# Patient Record
Sex: Female | Born: 2000 | Race: Black or African American | Hispanic: No | Marital: Single | Smoking: Never smoker
Health system: Southern US, Community
[De-identification: ages and names within clinical notes are randomized; demographics above are authoritative.]

## PROBLEM LIST (undated history)

## (undated) DIAGNOSIS — J45909 Unspecified asthma, uncomplicated: Secondary | ICD-10-CM

---
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
## 2018-08-10 ENCOUNTER — Other Ambulatory Visit: Payer: Self-pay

## 2018-08-10 ENCOUNTER — Emergency Department (HOSPITAL_BASED_OUTPATIENT_CLINIC_OR_DEPARTMENT_OTHER)
Admission: EM | Admit: 2018-08-10 | Discharge: 2018-08-11 | Disposition: A | Discharge status: Home / Self Care | Payer: Self-pay | Attending: Emergency Medicine | Admitting: Emergency Medicine

## 2018-08-10 ENCOUNTER — Encounter (HOSPITAL_BASED_OUTPATIENT_CLINIC_OR_DEPARTMENT_OTHER): Payer: Self-pay

## 2018-08-10 DIAGNOSIS — K529 Noninfective gastroenteritis and colitis, unspecified: Secondary | ICD-10-CM | POA: Insufficient documentation

## 2018-08-10 DIAGNOSIS — J45909 Unspecified asthma, uncomplicated: Secondary | ICD-10-CM | POA: Insufficient documentation

## 2018-08-10 LAB — URINALYSIS, ROUTINE W REFLEX MICROSCOPIC
Bilirubin Urine: NEGATIVE
Glucose, UA: NEGATIVE mg/dL
KETONES UR: 15 mg/dL — AB
LEUKOCYTES UA: NEGATIVE
NITRITE: NEGATIVE
PH: 5.5 (ref 5.0–8.0)
Protein, ur: 30 mg/dL — AB
Specific Gravity, Urine: 1.02 (ref 1.005–1.030)

## 2018-08-10 LAB — CBC WITH DIFFERENTIAL/PLATELET
BASOS ABS: 0 10*3/uL (ref 0.0–0.1)
Basophils Relative: 0 %
EOS PCT: 1 %
Eosinophils Absolute: 0 10*3/uL (ref 0.0–0.7)
HCT: 36.3 % (ref 36.0–46.0)
Hemoglobin: 11.9 g/dL — ABNORMAL LOW (ref 12.0–15.0)
LYMPHS ABS: 2 10*3/uL (ref 0.7–4.0)
LYMPHS PCT: 29 %
MCH: 27.2 pg (ref 26.0–34.0)
MCHC: 32.8 g/dL (ref 30.0–36.0)
MCV: 83.1 fL (ref 78.0–100.0)
MONO ABS: 0.5 10*3/uL (ref 0.1–1.0)
Monocytes Relative: 8 %
Neutro Abs: 4.1 10*3/uL (ref 1.7–7.7)
Neutrophils Relative %: 62 %
PLATELETS: 320 10*3/uL (ref 150–400)
RBC: 4.37 MIL/uL (ref 3.87–5.11)
RDW: 14.1 % (ref 11.5–15.5)
WBC: 6.7 10*3/uL (ref 4.0–10.5)

## 2018-08-10 LAB — COMPREHENSIVE METABOLIC PANEL
ALT: 19 U/L (ref 0–44)
ANION GAP: 12 (ref 5–15)
AST: 25 U/L (ref 15–41)
Albumin: 4.5 g/dL (ref 3.5–5.0)
Alkaline Phosphatase: 56 U/L (ref 38–126)
BILIRUBIN TOTAL: 0.6 mg/dL (ref 0.3–1.2)
BUN: 13 mg/dL (ref 6–20)
CALCIUM: 9.3 mg/dL (ref 8.9–10.3)
CO2: 24 mmol/L (ref 22–32)
Chloride: 106 mmol/L (ref 98–111)
Creatinine, Ser: 0.89 mg/dL (ref 0.44–1.00)
GFR calc non Af Amer: >60 mL/min (ref >60)
Glucose, Bld: 89 mg/dL (ref 70–99)
POTASSIUM: 3.5 mmol/L (ref 3.5–5.1)
Sodium: 142 mmol/L (ref 135–145)
TOTAL PROTEIN: 8.3 g/dL — AB (ref 6.5–8.1)

## 2018-08-10 LAB — URINALYSIS, MICROSCOPIC (REFLEX)

## 2018-08-10 LAB — PREGNANCY, URINE: Preg Test, Ur: NEGATIVE

## 2018-08-10 MED ORDER — SODIUM CHLORIDE 0.9 % IV BOLUS
1000.0000 mL | Freq: Once | INTRAVENOUS | Status: AC
  Administered 2018-08-10: 1000 mL via INTRAVENOUS

## 2018-08-10 MED ORDER — ONDANSETRON HCL 4 MG/2ML IJ SOLN
4.0000 mg | Freq: Once | INTRAMUSCULAR | Status: AC
  Administered 2018-08-10: 4 mg via INTRAVENOUS
  Filled 2018-08-10: qty 2

## 2018-08-10 NOTE — Discharge Instructions (Signed)
Please take medications as directed.  Please follow up with your primary care provider within 5-7 days for re-evaluation of your symptoms.   Please follow up with your primary doctor within the next 5-7 days.  If you do not have a primary care provider, information for a healthcare clinic has been provided for you to make arrangements for follow up care. Please return to the ER sooner if you have any new or worsening symptoms, or if you have any of the following symptoms:  Abdominal pain that does not go away.  You have a fever.  You keep throwing up (vomiting).  The pain is felt only in portions of the abdomen. Pain in the right side could possibly be appendicitis. In an adult, pain in the left lower portion of the abdomen could be colitis or diverticulitis.  You pass bloody or black tarry stools.  There is bright red blood in the stool.  The constipation stays for more than 4 days.  There is belly (abdominal) or rectal pain.  You do not seem to be getting better.  You have any questions or concerns.

## 2018-08-10 NOTE — ED Notes (Signed)
ED Provider at bedside. 

## 2018-08-10 NOTE — ED Triage Notes (Signed)
C/o n/v d day 2--NAD-steady gait 

## 2018-08-10 NOTE — ED Notes (Signed)
Pt tolerating PO fluids

## 2018-08-10 NOTE — ED Provider Notes (Signed)
[facility] EMERGENCY DEPARTMENT Provider Note   CSN: [id number] Arrival date & time: 08/10/18  2048     History   Chief Complaint Chief Complaint  Patient presents with  . Emesis    HPI Maddyn Kreager is a 17 y.o. female.  HPI   Patient is a 17 year old female with a history of asthma presents emergency department today for evaluation of nausea, vomiting and diarrhea that began yesterday after she ate a McMuffin.  Denies any bloody stools.  No fevers or chills.  No abdominal pain or urinary symptoms.  Denies any sick contacts with similar symptoms.  No recent trips out of the country.  Denies knowledge of eating any undercooked or rotten foods recently.  Past Medical History:  Diagnosis Date  . Asthma     There are no active problems to display for this patient.   History reviewed. No pertinent surgical history.   OB History   None      Home Medications    Prior to Admission medications   Medication Sig Start Date End Date Taking? Authorizing Provider  ondansetron (ZOFRAN) 4 MG tablet Take 1 tablet (4 mg total) by mouth every 8 (eight) hours as needed for nausea or vomiting. 08/11/18   Yuki Brunsman S, PA-C    Family History No family history on file.  Social History Social History   Tobacco Use  . Smoking status: Never Smoker  . Smokeless tobacco: Never Used  Substance Use Topics  . Alcohol use: Never    Frequency: Never  . Drug use: Never     Allergies   Patient has no known allergies.   Review of Systems Review of Systems  Constitutional: Negative for chills and fever.  HENT: Negative for congestion.   Eyes: Negative for visual disturbance.  Respiratory: Negative for shortness of breath.   Cardiovascular: Negative for chest pain.  Gastrointestinal: Positive for abdominal pain, diarrhea, nausea and vomiting. Negative for constipation.  Genitourinary: Negative for dysuria, frequency, hematuria and urgency.  Musculoskeletal:  Negative for back pain.  Skin: Negative for rash.  Neurological: Negative for headaches.    Physical Exam Updated Vital Signs BP 115/66 (BP Location: Left Arm)   Pulse 70   Temp 99 F (37.2 C) (Oral)   Resp 18   Ht 5\' 3"  (1.6 m)   Wt 78.5 kg   LMP 08/06/2018   SpO2 100%   BMI 30.65 kg/m   Physical Exam  Constitutional: She appears well-developed and well-nourished. No distress.  HENT:  Head: Normocephalic and atraumatic.  Eyes: Conjunctivae are normal.  Neck: Neck supple.  Cardiovascular: Normal rate, regular rhythm and normal heart sounds.  No murmur heard. Pulmonary/Chest: Effort normal and breath sounds normal. No stridor. No respiratory distress. She has no wheezes.  Abdominal: Soft. Bowel sounds are normal. She exhibits no distension. There is no tenderness. There is no guarding.  No CVA TTP  Musculoskeletal: She exhibits no edema.  Neurological: She is alert.  Skin: Skin is warm and dry.  Psychiatric: She has a normal mood and affect.  Nursing note and vitals reviewed.   ED Treatments / Results  Labs (all labs ordered are listed, but only abnormal results are displayed) Labs Reviewed  URINALYSIS, ROUTINE W REFLEX MICROSCOPIC - Abnormal; Notable for the following components:      Result Value   Hgb urine dipstick SMALL (*)    Ketones, ur 15 (*)    Protein, ur 30 (*)    All other components within  normal limits  URINALYSIS, MICROSCOPIC (REFLEX) - Abnormal; Notable for the following components:   Bacteria, UA RARE (*)    All other components within normal limits  CBC WITH DIFFERENTIAL/PLATELET - Abnormal; Notable for the following components:   Hemoglobin 11.9 (*)    All other components within normal limits  COMPREHENSIVE METABOLIC PANEL - Abnormal; Notable for the following components:   Total Protein 8.3 (*)    All other components within normal limits  PREGNANCY, URINE    EKG None  Radiology No results found.  Procedures Procedures (including  critical care time)  Medications Ordered in ED Medications  sodium chloride 0.9 % bolus 1,000 mL (0 mLs Intravenous Stopped 08/10/18 2353)  ondansetron (ZOFRAN) injection 4 mg (4 mg Intravenous Given 08/10/18 2329)     Initial Impression / Assessment and Plan / ED Course  I have reviewed the triage vital signs and the nursing notes.  Pertinent labs & imaging results that were available during my care of the patient were reviewed by me and considered in my medical decision making (see chart for details).     Final Clinical Impressions(s) / ED Diagnoses   Final diagnoses:  Gastroenteritis   Patient with symptoms consistent with viral gastroenteritis.  Vitals are stable, no fever.  No signs of dehydration, tolerating PO fluids > 6 oz.  Lungs are clear.  No focal abdominal pain, no concern for appendicitis, cholecystitis, pancreatitis, ruptured viscus, UTI, kidney stone, or any other abdominal etiology.    CBC is reassuring without leukocytosis.  Very mild anemia.  CMP with normal electrolytes, kidney and liver function.  UA is not suggestive of urinary tract infection, there is hematuria, proteinuria, and ketonuria. Pregnancy test negative.  Supportive therapy indicated with return if symptoms worsen.  Patient counseled on return precautions. Pt voiced understanding of the plan and reasons to return. All questions answered.  ED Discharge Orders         Ordered    ondansetron (ZOFRAN) 4 MG tablet  Every 8 hours PRN     08/11/18 0012           Karrie Meres, PA-C 08/11/18 0014    Tegeler, Canary Brim, MD 08/11/18 [id number]

## 2018-08-11 MED ORDER — ONDANSETRON HCL 4 MG PO TABS: 4.0000 mg | ORAL_TABLET | Freq: Three times a day (TID) | ORAL | 0 refills | Status: DC | PRN

## 2018-12-10 ENCOUNTER — Encounter (HOSPITAL_BASED_OUTPATIENT_CLINIC_OR_DEPARTMENT_OTHER): Payer: Self-pay

## 2018-12-10 ENCOUNTER — Emergency Department (HOSPITAL_BASED_OUTPATIENT_CLINIC_OR_DEPARTMENT_OTHER)
Admission: EM | Admit: 2018-12-10 | Discharge: 2018-12-10 | Disposition: A | Discharge status: Home / Self Care | Payer: Managed Care, Other (non HMO) | Attending: Emergency Medicine | Admitting: Emergency Medicine

## 2018-12-10 ENCOUNTER — Emergency Department (HOSPITAL_BASED_OUTPATIENT_CLINIC_OR_DEPARTMENT_OTHER): Payer: Managed Care, Other (non HMO)

## 2018-12-10 DIAGNOSIS — Z79899 Other long term (current) drug therapy: Secondary | ICD-10-CM | POA: Insufficient documentation

## 2018-12-10 DIAGNOSIS — M546 Pain in thoracic spine: Secondary | ICD-10-CM | POA: Diagnosis present

## 2018-12-10 DIAGNOSIS — J45909 Unspecified asthma, uncomplicated: Secondary | ICD-10-CM | POA: Diagnosis not present

## 2018-12-10 MED ORDER — IBUPROFEN 600 MG PO TABS: 600.0000 mg | ORAL_TABLET | Freq: Four times a day (QID) | ORAL | 0 refills | Status: DC | PRN

## 2018-12-10 MED ORDER — ACETAMINOPHEN 500 MG PO TABS: 500.0000 mg | ORAL_TABLET | Freq: Four times a day (QID) | ORAL | 0 refills | Status: DC | PRN

## 2018-12-10 MED ORDER — METHOCARBAMOL 500 MG PO TABS: 500.0000 mg | ORAL_TABLET | Freq: Two times a day (BID) | ORAL | 0 refills | Status: DC

## 2018-12-10 NOTE — ED Triage Notes (Signed)
Pt c/o upper back pain x 2 weeks-worse x 2 days after lifting tray at [facility] gait

## 2018-12-10 NOTE — ED Provider Notes (Signed)
[facility] EMERGENCY DEPARTMENT Provider Note   CSN: [id number] Arrival date & time: 12/10/18  1725     History   Chief Complaint Chief Complaint  Patient presents with  . Back Pain    HPI Robin Cabrera is a 18 y.o. female with history of asthma who presents with a 4-day history of upper back pain.  Patient reports she has had upper back pain for several years, however she lifted a heavy tray at work a few days ago and has had pain since.  Patient denies any numbness or tingling.  Her pain is worse when she sits a while.  She denies any fever, history of procedure to back, known cancer, history of IVDU, saddle anesthesia, bowel or bladder incontinence.  She has not tried anything at home for symptoms.  HPI  Past Medical History:  Diagnosis Date  . Asthma     There are no active problems to display for this patient.   History reviewed. No pertinent surgical history.   OB History   No obstetric history on file.      Home Medications    Prior to Admission medications   Medication Sig Start Date End Date Taking? Authorizing Provider  ondansetron (ZOFRAN) 4 MG tablet Take 1 tablet (4 mg total) by mouth every 8 (eight) hours as needed for nausea or vomiting. 08/11/18   Couture, Cortni S, PA-C    Family History No family history on file.  Social History Social History   Tobacco Use  . Smoking status: Never Smoker  . Smokeless tobacco: Never Used  Substance Use Topics  . Alcohol use: Never    Frequency: Never  . Drug use: Never     Allergies   Patient has no known allergies.   Review of Systems Review of Systems  Musculoskeletal: Positive for back pain.  Neurological: Negative for numbness.     Physical Exam Updated Vital Signs BP 125/81 (BP Location: Left Arm)   Pulse (!) 112   Temp 98.4 F (36.9 C) (Oral)   Resp 18   Ht 5\' 3"  (1.6 m)   Wt 79.8 kg   LMP 12/10/2018   SpO2 100%   BMI 31.18 kg/m   Physical Exam Vitals signs and  nursing note reviewed.  Constitutional:      General: She is not in acute distress.    Appearance: She is well-developed. She is not diaphoretic.  HENT:     Head: Normocephalic and atraumatic.     Mouth/Throat:     Pharynx: No oropharyngeal exudate.  Eyes:     General: No scleral icterus.       Right eye: No discharge.        Left eye: No discharge.     Conjunctiva/sclera: Conjunctivae normal.     Pupils: Pupils are equal, round, and reactive to light.  Neck:     Musculoskeletal: Normal range of motion and neck supple.     Thyroid: No thyromegaly.  Cardiovascular:     Rate and Rhythm: Normal rate and regular rhythm.     Heart sounds: Normal heart sounds. No murmur. No friction rub. No gallop.   Pulmonary:     Effort: Pulmonary effort is normal. No respiratory distress.     Breath sounds: Normal breath sounds. No stridor. No wheezing or rales.  Abdominal:     General: Bowel sounds are normal. There is no distension.     Palpations: Abdomen is soft.     Tenderness: There is no  abdominal tenderness. There is no guarding or rebound.  Musculoskeletal:     Comments: Midline thoracic tenderness, no midline cervical and lumbar tenderness with tenderness and spasm to bilateral upper trapezius muscles  Lymphadenopathy:     Cervical: No cervical adenopathy.  Skin:    General: Skin is warm and dry.     Coloration: Skin is not pale.     Findings: No rash.  Neurological:     Mental Status: She is alert.     Coordination: Coordination normal.     Comments: Equal bilateral grip strength, 5/5 strength all 4 extremities, normal sensation      ED Treatments / Results  Labs (all labs ordered are listed, but only abnormal results are displayed) Labs Reviewed - No data to display  EKG None  Radiology Dg Thoracic Spine W/swimmers  Result Date: 12/10/2018 CLINICAL DATA:  Thoracic spine pain for 4 days.  No known injury. EXAM: THORACIC SPINE - 3 VIEWS COMPARISON:  None. FINDINGS: There  is no evidence of thoracic spine fracture. Alignment is normal. No other significant bone abnormalities are identified. IMPRESSION: Normal exam. Electronically Signed   By: Drusilla Kanner M.D.   On: 12/10/2018 18:29    Procedures Procedures (including critical care time)  Medications Ordered in ED Medications - No data to display   Initial Impression / Assessment and Plan / ED Course  I have reviewed the triage vital signs and the nursing notes.  Pertinent labs & imaging results that were available during my care of the patient were reviewed by me and considered in my medical decision making (see chart for details).     Patient with back pain.  No neurological deficits and normal neuro exam.  Patient is ambulatory.  No loss of bowel or bladder control.  No concern for cauda equina.  No fever, night sweats, weight loss, h/o cancer, IVDA, no recent procedure to back. No urinary symptoms suggestive of UTI.  Supportive care and return precaution discussed.  Patient be discharged home with ibuprofen, Robaxin, Tylenol, recommendations for heat and stretching.  Patient understands and agrees with plan.  Patient vital stable throughout ED course and discharged in satisfactory condition.   Final Clinical Impressions(s) / ED Diagnoses   Final diagnoses:  Thoracic back pain    ED Discharge Orders    None       Emi Holes, Cordelia Poche 12/10/18 1850    Maia Plan, MD 12/11/18 [phone]

## 2018-12-10 NOTE — Discharge Instructions (Signed)
Take ibuprofen every 6 hours as needed for pain.  You can alternate with Tylenol as prescribed.  Take Robaxin twice daily as needed for muscle pain or spasms.  Do not drive or operate machinery while taking this medication.  Use heat alternating 20 minutes on, 20 minutes off.  Attempt the stretches we discussed.  Please return the emergency department you develop any new or worsening symptoms.

## 2019-11-02 ENCOUNTER — Encounter (HOSPITAL_BASED_OUTPATIENT_CLINIC_OR_DEPARTMENT_OTHER): Payer: Self-pay

## 2019-11-02 ENCOUNTER — Emergency Department (HOSPITAL_BASED_OUTPATIENT_CLINIC_OR_DEPARTMENT_OTHER): Payer: 59

## 2019-11-02 ENCOUNTER — Other Ambulatory Visit: Payer: Self-pay

## 2019-11-02 ENCOUNTER — Emergency Department (HOSPITAL_BASED_OUTPATIENT_CLINIC_OR_DEPARTMENT_OTHER)
Admission: EM | Admit: 2019-11-02 | Discharge: 2019-11-02 | Disposition: A | Discharge status: Home / Self Care | Payer: 59 | Attending: Emergency Medicine | Admitting: Emergency Medicine

## 2019-11-02 DIAGNOSIS — A64 Unspecified sexually transmitted disease: Secondary | ICD-10-CM

## 2019-11-02 DIAGNOSIS — A599 Trichomoniasis, unspecified: Secondary | ICD-10-CM | POA: Insufficient documentation

## 2019-11-02 DIAGNOSIS — N760 Acute vaginitis: Secondary | ICD-10-CM | POA: Diagnosis not present

## 2019-11-02 DIAGNOSIS — R1032 Left lower quadrant pain: Secondary | ICD-10-CM | POA: Diagnosis present

## 2019-11-02 DIAGNOSIS — R102 Pelvic and perineal pain: Secondary | ICD-10-CM | POA: Diagnosis not present

## 2019-11-02 DIAGNOSIS — B9689 Other specified bacterial agents as the cause of diseases classified elsewhere: Secondary | ICD-10-CM | POA: Insufficient documentation

## 2019-11-02 LAB — URINALYSIS, ROUTINE W REFLEX MICROSCOPIC
Bilirubin Urine: NEGATIVE
Glucose, UA: NEGATIVE mg/dL
Hgb urine dipstick: NEGATIVE
Ketones, ur: NEGATIVE mg/dL
Leukocytes,Ua: NEGATIVE
Nitrite: NEGATIVE
Protein, ur: NEGATIVE mg/dL
Specific Gravity, Urine: 1.025 (ref 1.005–1.030)
pH: 6.5 (ref 5.0–8.0)

## 2019-11-02 LAB — WET PREP, GENITAL
Sperm: NONE SEEN
Yeast Wet Prep HPF POC: NONE SEEN

## 2019-11-02 LAB — PREGNANCY, URINE: Preg Test, Ur: NEGATIVE

## 2019-11-02 MED ORDER — CEFTRIAXONE SODIUM 250 MG IJ SOLR
250.0000 mg | Freq: Once | INTRAMUSCULAR | Status: AC
  Administered 2019-11-02: 250 mg via INTRAMUSCULAR
  Filled 2019-11-02: qty 250

## 2019-11-02 MED ORDER — AZITHROMYCIN 250 MG PO TABS
1000.0000 mg | ORAL_TABLET | Freq: Once | ORAL | Status: AC
  Administered 2019-11-02: 1000 mg via ORAL
  Filled 2019-11-02: qty 4

## 2019-11-02 MED ORDER — METRONIDAZOLE 500 MG PO TABS: 500.0000 mg | ORAL_TABLET | Freq: Two times a day (BID) | ORAL | 0 refills | Status: AC

## 2019-11-02 MED ORDER — METRONIDAZOLE 500 MG PO TABS
500.0000 mg | ORAL_TABLET | Freq: Once | ORAL | Status: AC
  Administered 2019-11-02: 500 mg via ORAL
  Filled 2019-11-02: qty 1

## 2019-11-02 NOTE — ED Triage Notes (Signed)
Pt c/o left side abd pain x 2 days-denies n/v/d-NAD-steady gait

## 2019-11-02 NOTE — ED Provider Notes (Signed)
Patient signed out to me awaiting ultrasound of the pelvis to rule out torsion.  Positive for trichomonas and BV.  Has been treated for STDs.  Will prescribe Flagyl for trichomonas and BV.  Ultrasound is unremarkable.  No torsion.  No abscess.  Discharged in good condition.  This chart was dictated using voice recognition software.  Despite best efforts to proofread,  errors can occur which can change the documentation meaning.     Lennice Sites, DO 11/02/19 1609

## 2019-11-02 NOTE — ED Provider Notes (Signed)
[facility] EMERGENCY DEPARTMENT Provider Note   CSN: [id number] Arrival date & time: 11/02/19  1246     History Chief Complaint  Patient presents with  . Abdominal Pain    Robin Cabrera is a 18 y.o. female presents to the ED with left lower quadrant abdominal and pelvic pain.  She reports she has had intermittent severe pain in her left lower abdomen for the past 3 days.  She said she came off her menstrual period 4 to 5 days ago, and that this pain felt a Monds bit like her menstrual cramping but is much more significant or worse.  She denies having discounted pain before.  She reports nausea but denies any vomiting.  She reports is having daily bowel movements denies any constipation.  She denies any abdominal surgical history.  She is sexually active with a partner (last intercourse about 2-3 weeks ago, unprotected vaginal sex), denies any vaginal discharge or history of STI or STD.  She does report that she and her boyfriend had recently broken up and gotten back together.  She has never had a pelvic ultrasound is unaware of any history of fibroids or cysts.  HPI     Past Medical History:  Diagnosis Date  . Asthma     There are no problems to display for this patient.   History reviewed. No pertinent surgical history.   OB History   No obstetric history on file.     No family history on file.  Social History   Tobacco Use  . Smoking status: Never Smoker  . Smokeless tobacco: Never Used  Substance Use Topics  . Alcohol use: Never  . Drug use: Never    Home Medications Prior to Admission medications   Medication Sig Start Date End Date Taking? Authorizing Provider  metroNIDAZOLE (FLAGYL) 500 MG tablet Take 1 tablet (500 mg total) by mouth 2 (two) times daily for 7 days. 11/02/19 11/09/19  Terald Sleeper, MD    Allergies    Patient has no known allergies.  Review of Systems   Review of Systems  Constitutional: Negative for chills and  fever.  Respiratory: Negative for cough and shortness of breath.   Cardiovascular: Negative for chest pain and palpitations.  Gastrointestinal: Positive for abdominal pain and nausea. Negative for vomiting.  Genitourinary: Positive for pelvic pain. Negative for dysuria, hematuria, vaginal bleeding, vaginal discharge and vaginal pain.  Skin: Negative for color change and rash.  Psychiatric/Behavioral: Negative for agitation and confusion.  All other systems reviewed and are negative.   Physical Exam Updated Vital Signs BP 116/75 (BP Location: Right Arm)   Pulse 75   Temp 98.7 F (37.1 C) (Oral)   Resp 17   Ht 5\' 3"  (1.6 m)   Wt 71.7 kg   LMP 10/23/2019   SpO2 100%   BMI 27.99 kg/m   Physical Exam Vitals and nursing note reviewed.  Constitutional:      General: She is not in acute distress.    Appearance: She is well-developed.  HENT:     Head: Normocephalic and atraumatic.  Eyes:     Conjunctiva/sclera: Conjunctivae normal.  Cardiovascular:     Rate and Rhythm: Normal rate and regular rhythm.     Heart sounds: No murmur.  Pulmonary:     Effort: Pulmonary effort is normal. No respiratory distress.     Breath sounds: Normal breath sounds.  Abdominal:     Palpations: Abdomen is soft.     Tenderness: There  is no abdominal tenderness. There is no guarding or rebound. Negative signs include Murphy's sign, Rovsing's sign and McBurney's sign.  Genitourinary:    Comments: Exam performed with chaperone present. External: Normal external female genitalia. No lesions, rashes, drainage, or suspicious lymph nodes. Internal: No CMT. Cervix closed and without erythema. Thick green-white discharge note, protruding from cervical os. No adnexal tenderness, swelling, or masses. No lacerations. No foreign bodies.  Musculoskeletal:     Cervical back: Neck supple.  Skin:    General: Skin is warm and dry.  Neurological:     Mental Status: She is alert.     ED Results / Procedures /  Treatments   Labs (all labs ordered are listed, but only abnormal results are displayed) Labs Reviewed  WET PREP, GENITAL - Abnormal; Notable for the following components:      Result Value   Trich, Wet Prep PRESENT (*)    Clue Cells Wet Prep HPF POC PRESENT (*)    WBC, Wet Prep HPF POC MODERATE (*)    All other components within normal limits  URINALYSIS, ROUTINE W REFLEX MICROSCOPIC  PREGNANCY, URINE  GC/CHLAMYDIA PROBE AMP ([place]) NOT AT [facility]    EKG None  Radiology US PELVIC COMPLETE W TRANSVAGINAL AND TORSION R/O  Result Date: 11/02/2019 CLINICAL DATA:  Pelvic pain question LEFT ovarian torsion EXAM: TRANSABDOMINAL AND TRANSVAGINAL ULTRASOUND OF PELVIS DOPPLER ULTRASOUND OF OVARIES TECHNIQUE: Both transabdominal and transvaginal ultrasound examinations of the pelvis were performed. Transabdominal technique was performed for global imaging of the pelvis including uterus, ovaries, adnexal regions, and pelvic cul-de-sac. It was necessary to proceed with endovaginal exam following the transabdominal exam to visualize the uterus, endometrium, and ovaries. Color and duplex Doppler ultrasound was utilized to evaluate blood flow to the ovaries. COMPARISON:  None FINDINGS: Uterus Measurements: 6.1 x 3.8 x 4.0 cm = volume: 49 mL. Anteverted. Normal morphology without mass Endometrium Thickness: 6 mm.  No endometrial fluid or focal abnormality Right ovary Measurements: 2.8 x 1.6 x 2.8 cm = volume: 6.7 mL. Normal morphology without mass. Left ovary Measurements: 2.5 x 1.8 x 2.2 cm = volume: 5.2 mL. Dominant follicle without mass. Pulsed Doppler evaluation of both ovaries demonstrates normal low-resistance arterial and venous waveforms. Other findings No free pelvic fluid.  No adnexal masses. IMPRESSION: Normal exam. Electronically Signed   By: Lavonia Dana M.D.   On: 11/02/2019 16:04    Procedures Procedures (including critical care time)  Medications Ordered in ED Medications  cefTRIAXone  (ROCEPHIN) injection 250 mg (250 mg Intramuscular Given 11/02/19 1549)  azithromycin (ZITHROMAX) tablet 1,000 mg (1,000 mg Oral Given 11/02/19 1547)  metroNIDAZOLE (FLAGYL) tablet 500 mg (500 mg Oral Given 11/02/19 1547)    ED Course  I have reviewed the triage vital signs and the nursing notes.  Pertinent labs & imaging results that were available during my care of the patient were reviewed by me and considered in my medical decision making (see chart for details).  18 yo female presenting with pelvic pain and discomfort x 3-4 days, following the conclusion of her LMP.  On exam has signs of possible STI/pelvic infection without evidence of PID, sepsis, or TOA.   DDx includes pelvic infection vs. Ruptured cyst vs ovarian torsion vs other  UA negative for infection.  U preg negative.  Plan to obtain pelvic ultrasound  Final Clinical Impression(s) / ED Diagnoses Final diagnoses:  Pelvic pain  Trichomonas infection  BV (bacterial vaginosis)  STD (female)    Rx /  DC Orders ED Discharge Orders         Ordered    metroNIDAZOLE (FLAGYL) 500 MG tablet  2 times daily     11/02/19 1542           Terald Sleeperrifan, Merrick Feutz J, MD 11/02/19 [id number]

## 2019-11-02 NOTE — Discharge Instructions (Signed)
Please take a few minutes to read these important discharge instructions below: You have been diagnosed with and treated for a presumed sexually transmitted infection such as chlamydia, gonorrhea, or trichomoniasis.  Chlamydia and gonorrhea are the most commonly reported communicable diseases in the [place]. They are especially common among people younger than 20 years of age. Infection without symptoms is common in men and women. Your partner may be infected but not display symptoms.  Complications of these infections without treatment include serious pelvic infections, ectopic pregnancy, and infertility. Other infections such as HIV or HPV, which can occur in patients with sexually transmitted infections, can lead to cancer or death. To minimize the spread of the infection, you should avoid sexual intercourse for 7 days or until symptoms have resolved.  To minimize the spread of the infection, you should advise your sexual partner(s) to be evaluated, tested and treated. This includes all sexual partners within the past 60 days or your last sexual partner if last contact was greater than 60 days.  To minimize the risk of reinfection, you should abstain from sexual intercourse until your sexual partners have been tested and treated.  Consistent condom use is important in preventing the spread of sexually transmitted infections.  Do you need to get re-tested? Studies reveal that infection with chlamydia or gonorrhea is common among those treated within the preceding several months. Testing for gonorrhea and chlamydia in approximately 3 months is recommended even if you believe your sexual partner has been treated. Based on your history or demographics, your doctor may feel that you are higher risk for HIV and syphilis.  Talk to your doctor about whether you should be tested for these diseases at this time.  When should you come back to the Emergency Department? If your symptoms have not  improved after 48 hours from when you received your antibiotics, you should contact your primary care doctor or return to the Emergency Department.  These may be signs of an infection that has not been fully treated. If you develop worsening pain, worsening discharge from your penis or vagina, fevers of greater than 100.4 F, or have any other new or alarming symptoms, you should return promptly to the Emergency Department.  You should follow up with your primary care doctor's office in 1 week to ensure that your symptoms have resolved.  

## 2019-11-07 LAB — GC/CHLAMYDIA PROBE AMP (~~LOC~~) NOT AT ARMC
Chlamydia: POSITIVE — AB
Neisseria Gonorrhea: NEGATIVE

## 2019-11-16 ENCOUNTER — Encounter (HOSPITAL_BASED_OUTPATIENT_CLINIC_OR_DEPARTMENT_OTHER): Payer: Self-pay | Admitting: Emergency Medicine

## 2019-11-16 ENCOUNTER — Other Ambulatory Visit: Payer: Self-pay

## 2019-11-16 DIAGNOSIS — R112 Nausea with vomiting, unspecified: Secondary | ICD-10-CM | POA: Insufficient documentation

## 2019-11-16 DIAGNOSIS — J45909 Unspecified asthma, uncomplicated: Secondary | ICD-10-CM | POA: Insufficient documentation

## 2019-11-16 DIAGNOSIS — R1110 Vomiting, unspecified: Secondary | ICD-10-CM | POA: Diagnosis present

## 2019-11-16 DIAGNOSIS — R1084 Generalized abdominal pain: Secondary | ICD-10-CM | POA: Insufficient documentation

## 2019-11-16 LAB — CBC
HCT: 39.4 % (ref 36.0–46.0)
Hemoglobin: 12.5 g/dL (ref 12.0–15.0)
MCH: 26.8 pg (ref 26.0–34.0)
MCHC: 31.7 g/dL (ref 30.0–36.0)
MCV: 84.4 fL (ref 80.0–100.0)
Platelets: 339 10*3/uL (ref 150–400)
RBC: 4.67 MIL/uL (ref 3.87–5.11)
RDW: 14 % (ref 11.5–15.5)
WBC: 6.1 10*3/uL (ref 4.0–10.5)
nRBC: 0 % (ref 0.0–0.2)

## 2019-11-16 NOTE — ED Triage Notes (Signed)
Vomiting today and started on menses also. C/o Vomiting and diarrhea. States was just treated for BV last week.

## 2019-11-17 ENCOUNTER — Emergency Department (HOSPITAL_BASED_OUTPATIENT_CLINIC_OR_DEPARTMENT_OTHER)
Admission: EM | Admit: 2019-11-17 | Discharge: 2019-11-17 | Disposition: A | Discharge status: Home / Self Care | Payer: 59 | Attending: Emergency Medicine | Admitting: Emergency Medicine

## 2019-11-17 DIAGNOSIS — R112 Nausea with vomiting, unspecified: Secondary | ICD-10-CM

## 2019-11-17 DIAGNOSIS — R109 Unspecified abdominal pain: Secondary | ICD-10-CM

## 2019-11-17 LAB — URINALYSIS, MICROSCOPIC (REFLEX)

## 2019-11-17 LAB — URINALYSIS, ROUTINE W REFLEX MICROSCOPIC
Bilirubin Urine: NEGATIVE
Glucose, UA: NEGATIVE mg/dL
Ketones, ur: 15 mg/dL — AB
Leukocytes,Ua: NEGATIVE
Nitrite: NEGATIVE
Protein, ur: 30 mg/dL — AB
Specific Gravity, Urine: >1.03 — ABNORMAL HIGH (ref 1.005–1.030)
pH: 6.5 (ref 5.0–8.0)

## 2019-11-17 LAB — WET PREP, GENITAL
Clue Cells Wet Prep HPF POC: NONE SEEN
Sperm: NONE SEEN
Trich, Wet Prep: NONE SEEN
Yeast Wet Prep HPF POC: NONE SEEN

## 2019-11-17 LAB — COMPREHENSIVE METABOLIC PANEL
ALT: 16 U/L (ref 0–44)
AST: 24 U/L (ref 15–41)
Albumin: 4.6 g/dL (ref 3.5–5.0)
Alkaline Phosphatase: 50 U/L (ref 38–126)
Anion gap: 10 (ref 5–15)
BUN: 8 mg/dL (ref 6–20)
CO2: 23 mmol/L (ref 22–32)
Calcium: 9.1 mg/dL (ref 8.9–10.3)
Chloride: 105 mmol/L (ref 98–111)
Creatinine, Ser: 0.64 mg/dL (ref 0.44–1.00)
GFR calc Af Amer: >60 mL/min (ref >60)
GFR calc non Af Amer: >60 mL/min (ref >60)
Glucose, Bld: 121 mg/dL — ABNORMAL HIGH (ref 70–99)
Potassium: 3.6 mmol/L (ref 3.5–5.1)
Sodium: 138 mmol/L (ref 135–145)
Total Bilirubin: 0.6 mg/dL (ref 0.3–1.2)
Total Protein: 7.8 g/dL (ref 6.5–8.1)

## 2019-11-17 LAB — LIPASE, BLOOD: Lipase: 19 U/L (ref 11–51)

## 2019-11-17 LAB — PREGNANCY, URINE: Preg Test, Ur: NEGATIVE

## 2019-11-17 MED ORDER — ONDANSETRON 4 MG PO TBDP
4.0000 mg | ORAL_TABLET | Freq: Once | ORAL | Status: AC
  Administered 2019-11-17: 4 mg via ORAL
  Filled 2019-11-17: qty 1

## 2019-11-17 MED ORDER — KETOROLAC TROMETHAMINE 60 MG/2ML IM SOLN
30.0000 mg | Freq: Once | INTRAMUSCULAR | Status: AC
  Administered 2019-11-17: 30 mg via INTRAMUSCULAR
  Filled 2019-11-17: qty 2

## 2019-11-17 MED ORDER — ONDANSETRON 4 MG PO TBDP: 4.0000 mg | ORAL_TABLET | Freq: Three times a day (TID) | ORAL | 0 refills | Status: AC | PRN

## 2019-11-17 NOTE — ED Provider Notes (Signed)
[facility] EMERGENCY DEPARTMENT Provider Note  CSN: [id number] Arrival date & time: 11/16/19 2311  Chief Complaint(s) Emesis  HPI Robin Cabrera is a 19 y.o. female currently on her menstrual cycle, recently treated for chlamydia, trichomonas, bacterial vaginosis who presents with left lower quadrant abdominal cramping.  Patient was seen several weeks ago for the same and noted to have the above infections.  Reports that she was empirically treated in the emergency department for GC/chlamydia.  She was prescribed Flagyl but for the first several days was only taking 500 mg/day.  She reports resolved discharge.  She believes her partner was treated but is unsure.  She is also endorsing associated nausea with nonbloody nonbilious emesis.  She is also endorsing loose stools.  Cramping is intermittent.  Currently very mild discomfort.  No urinary symptoms.  No other physical complaints.  HPI  Past Medical History Past Medical History:  Diagnosis Date  . Asthma    There are no problems to display for this patient.  Home Medication(s) Prior to Admission medications   Medication Sig Start Date End Date Taking? Authorizing Provider  ondansetron (ZOFRAN ODT) 4 MG disintegrating tablet Take 1 tablet (4 mg total) by mouth every 8 (eight) hours as needed for up to 3 days for nausea or vomiting. 11/17/19 11/20/19  Nira Conn, MD                                                                                                                                    Past Surgical History History reviewed. No pertinent surgical history. Family History No family history on file.  Social History Social History   Tobacco Use  . Smoking status: Never Smoker  . Smokeless tobacco: Never Used  Substance Use Topics  . Alcohol use: Never  . Drug use: Never   Allergies Patient has no known allergies.  Review of Systems Review of Systems All other systems are reviewed and are negative  for acute change except as noted in the HPI  Physical Exam Vital Signs  I have reviewed the triage vital signs BP 132/78 (BP Location: Right Arm)   Pulse 80   Temp 99.1 F (37.3 C) (Oral)   Resp 16   Ht 5\' 3"  (1.6 m)   Wt 71.2 kg   LMP 11/16/2019   SpO2 99%   BMI 27.81 kg/m   Physical Exam Vitals reviewed. Exam conducted with a chaperone present.  Constitutional:      General: She is not in acute distress.    Appearance: She is well-developed. She is not diaphoretic.  HENT:     Head: Normocephalic and atraumatic.     Right Ear: External ear normal.     Left Ear: External ear normal.     Nose: Nose normal.  Eyes:     General: No scleral icterus.    Conjunctiva/sclera: Conjunctivae normal.  Neck:     Trachea: Phonation  normal.  Cardiovascular:     Rate and Rhythm: Normal rate and regular rhythm.  Pulmonary:     Effort: Pulmonary effort is normal. No respiratory distress.     Breath sounds: No stridor.  Abdominal:     General: There is no distension.     Tenderness: There is no abdominal tenderness.  Genitourinary:    Vagina: No vaginal discharge or erythema.     Cervix: Cervical bleeding present. No cervical motion tenderness, discharge or friability.     Uterus: Not tender.      Adnexa:        Right: No tenderness.         Left: No tenderness.    Musculoskeletal:        General: Normal range of motion.     Cervical back: Normal range of motion.  Neurological:     Mental Status: She is alert and oriented to person, place, and time.  Psychiatric:        Behavior: Behavior normal.     ED Results and Treatments Labs (all labs ordered are listed, but only abnormal results are displayed) Labs Reviewed  WET PREP, GENITAL - Abnormal; Notable for the following components:      Result Value   WBC, Wet Prep HPF POC MANY (*)    All other components within normal limits  COMPREHENSIVE METABOLIC PANEL - Abnormal; Notable for the following components:   Glucose, Bld  121 (*)    All other components within normal limits  URINALYSIS, ROUTINE W REFLEX MICROSCOPIC - Abnormal; Notable for the following components:   Specific Gravity, Urine >1.030 (*)    Hgb urine dipstick MODERATE (*)    Ketones, ur 15 (*)    Protein, ur 30 (*)    All other components within normal limits  URINALYSIS, MICROSCOPIC (REFLEX) - Abnormal; Notable for the following components:   Bacteria, UA RARE (*)    All other components within normal limits  LIPASE, BLOOD  CBC  PREGNANCY, URINE  GC/CHLAMYDIA PROBE AMP ([place]) NOT AT [facility]                                                                                                                         EKG  EKG Interpretation  Date/Time:    Ventricular Rate:    PR Interval:    QRS Duration:   QT Interval:    QTC Calculation:   R Axis:     Text Interpretation:        Radiology No results found.  Pertinent labs & imaging results that were available during my care of the patient were reviewed by me and considered in my medical decision making (see chart for details).  Medications Ordered in ED Medications  ondansetron (ZOFRAN-ODT) disintegrating tablet 4 mg (has no administration in time range)  ketorolac (TORADOL) injection 30 mg (has no administration in time range)  Procedures Procedures  (including critical care time)  Medical Decision Making / ED Course I have reviewed the nursing notes for this encounter and the patient's prior records (if available in EHR or on provided paperwork).   Robin Cabrera was evaluated in Emergency Department on 11/17/2019 for the symptoms described in the history of present illness. She was evaluated in the context of the global COVID-19 pandemic, which necessitated consideration that the patient might be at risk for infection with the SARS-CoV-2 virus  that causes COVID-19. Institutional protocols and algorithms that pertain to the evaluation of patients at risk for COVID-19 are in a state of rapid change based on information released by regulatory bodies including the CDC and federal and state organizations. These policies and algorithms were followed during the patient's care in the ED.  Patient presents with lower abdominal cramping GU exam without evidence of cervicitis or PID No tenderness to palpation on exam. Labs reassuring without leukocytosis, anemia.  No significant electrolyte derangement renal sufficiency. UPT negative. UA with blood in the urine likely from her menstrual cycle, without evidence of infection. Wet prep/GC chlamydia swabs obtained.  Wet prep negative.  Patient treated symptomatically with antiemetics and Toradol.  Able to tolerate oral intake.  The patient appears reasonably screened and/or stabilized for discharge and I doubt any other medical condition or other [facility] requiring further screening, evaluation, or treatment in the ED at this time prior to discharge.  The patient is safe for discharge with strict return precautions.       Final Clinical Impression(s) / ED Diagnoses Final diagnoses:  Abdominal cramping  Nausea and vomiting in adult    The patient appears reasonably screened and/or stabilized for discharge and I doubt any other medical condition or other [facility] requiring further screening, evaluation, or treatment in the ED at this time prior to discharge.  Disposition: Discharge  Condition: Good  I have discussed the results, Dx and Tx plan with the patient who expressed understanding and agree(s) with the plan. Discharge instructions discussed at great length. The patient was given strict return precautions who verbalized understanding of the instructions. No further questions at time of discharge.    ED Discharge Orders         Ordered    ondansetron (ZOFRAN ODT) 4 MG disintegrating tablet   Every 8 hours PRN     11/17/19 0250            Follow Up: Primary care provider  Schedule an appointment as soon as possible for a visit  As needed      This chart was dictated using voice recognition software.  Despite best efforts to proofread,  errors can occur which can change the documentation meaning.   Nira Conn, MD 11/17/19 [phone]

## 2019-11-17 NOTE — ED Notes (Signed)
Po fluids given

## 2019-11-18 LAB — GC/CHLAMYDIA PROBE AMP (~~LOC~~) NOT AT ARMC
Chlamydia: NEGATIVE
Neisseria Gonorrhea: NEGATIVE

## 2020-06-01 IMAGING — US US PELVIS COMPLETE TRANSABD/TRANSVAG W DUPLEX
1 series · 13 of 25 positions shown · non-contrast
Comparison: None

CLINICAL DATA: Pelvic pain question LEFT ovarian torsion

EXAM:
TRANSABDOMINAL AND TRANSVAGINAL ULTRASOUND OF PELVIS
DOPPLER ULTRASOUND OF OVARIES
TECHNIQUE: Both transabdominal and transvaginal ultrasound examinations of the
pelvis were performed. Transabdominal technique was performed for
global imaging of the pelvis including uterus, ovaries, adnexal
regions, and pelvic cul-de-sac.
It was necessary to proceed with endovaginal exam following the
transabdominal exam to visualize the uterus, endometrium, and
ovaries. Color and duplex Doppler ultrasound was utilized to
evaluate blood flow to the ovaries.

[Series 1: us pelvis complete transabd/transvag w duplex · 42 acquisitions, 13 frames shown]
[im 1/42]
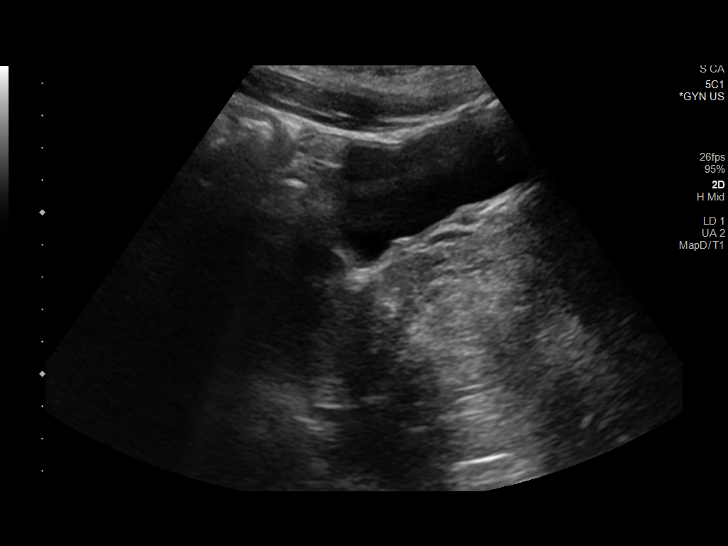
[im 4/42]
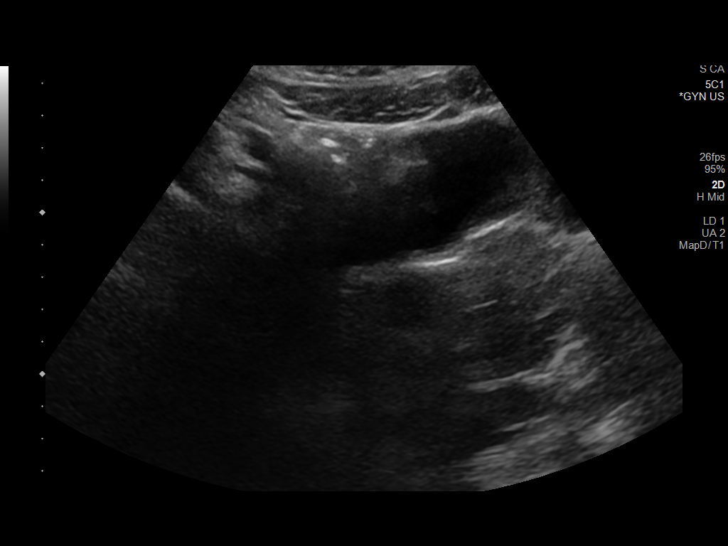
[im 7/42]
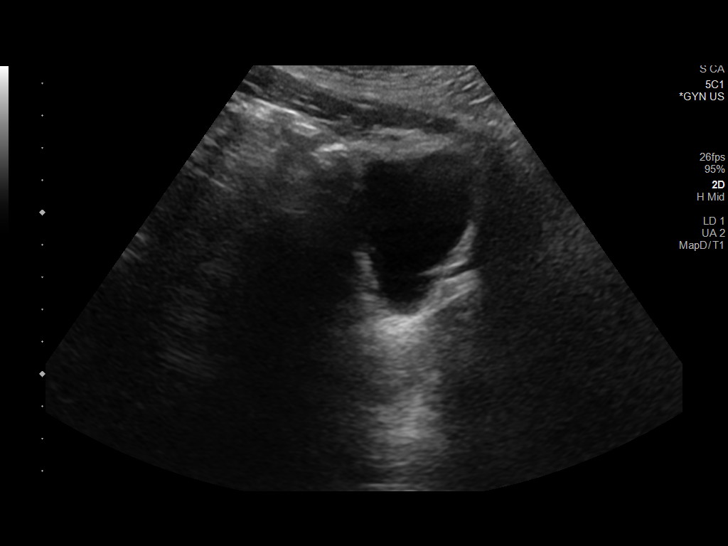
[im 11/42]
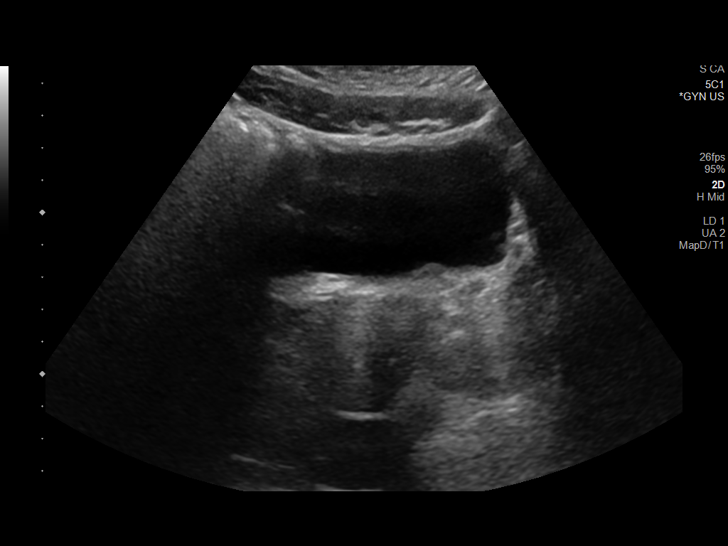
[im 14/42]
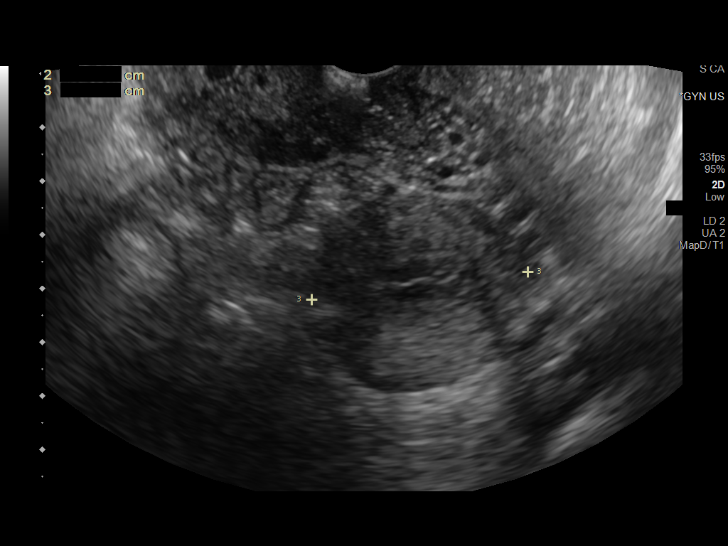
[im 18/42]
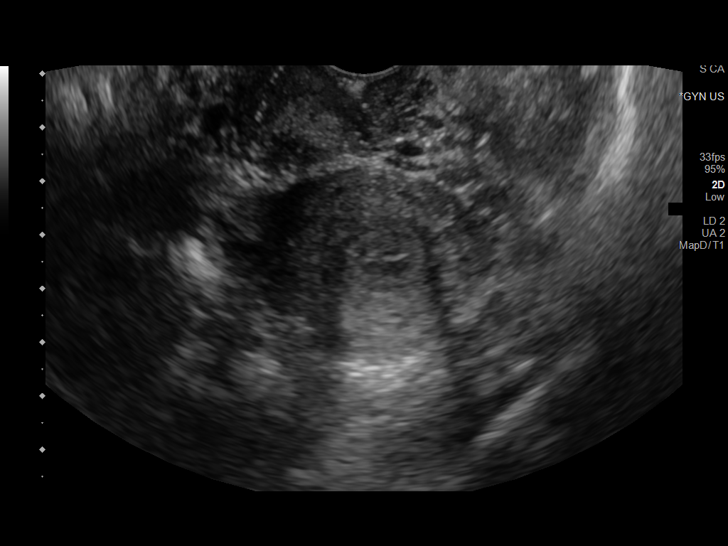
[im 21/42]
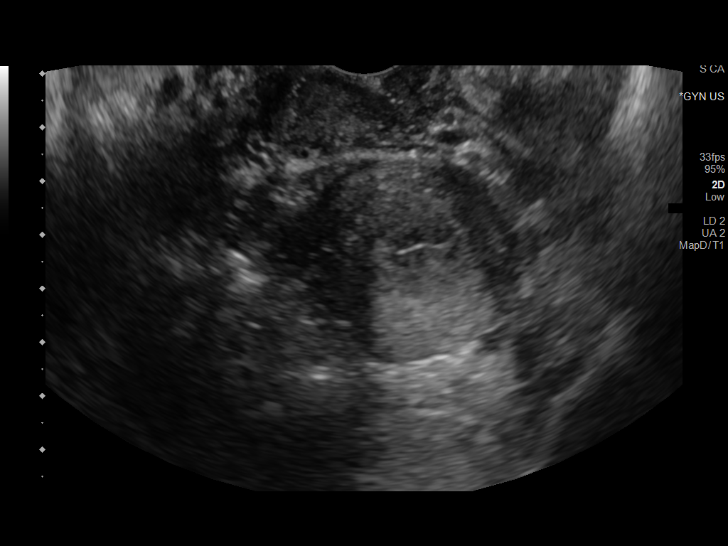
[im 24/42]
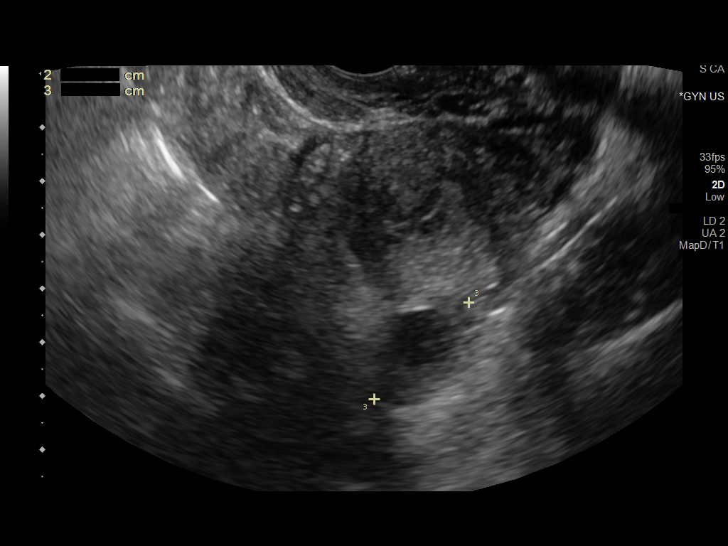
[im 28/42]
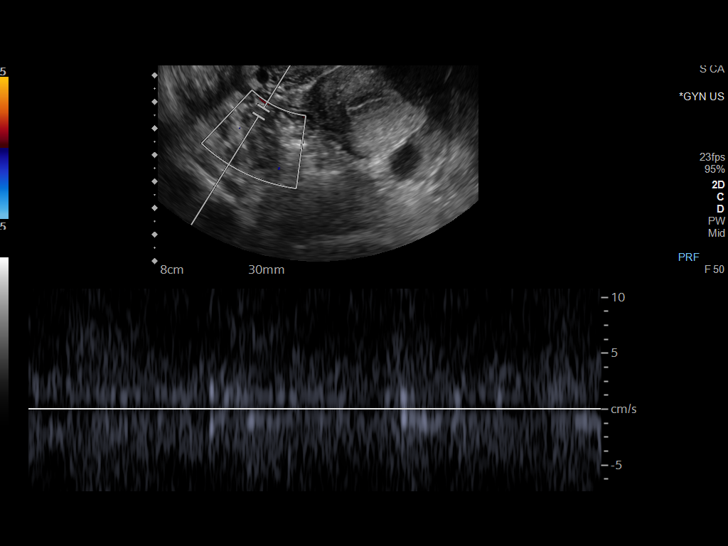
[im 31/42]
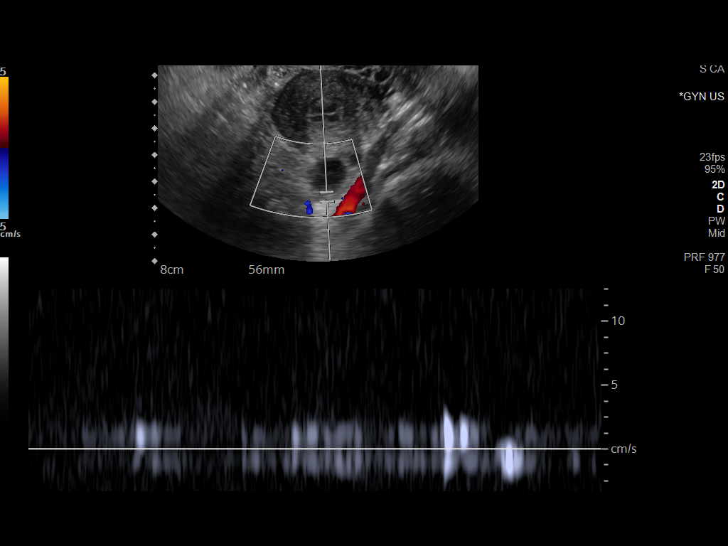
[im 35/42]
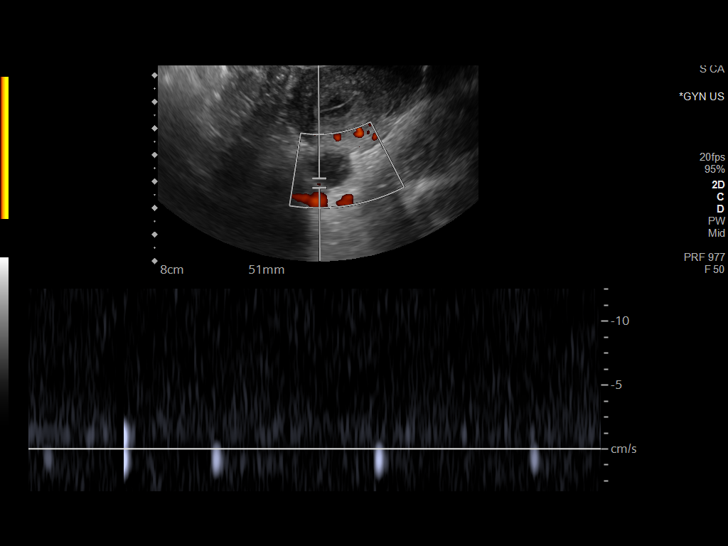
[im 38/42]
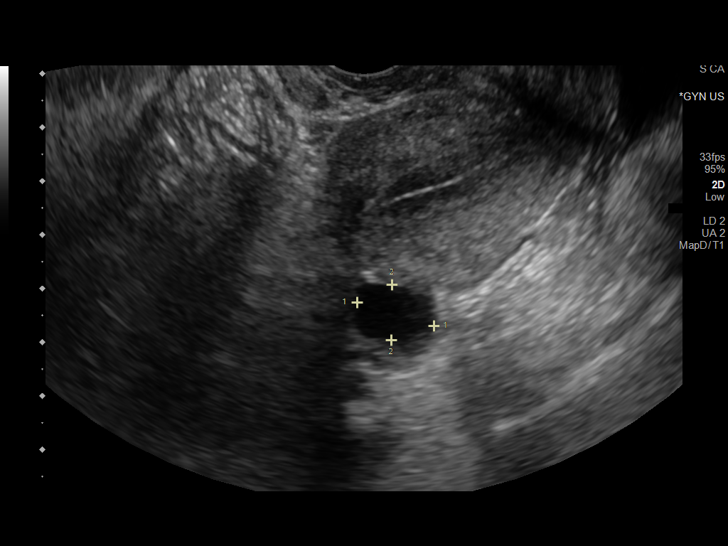
[im 42/42]
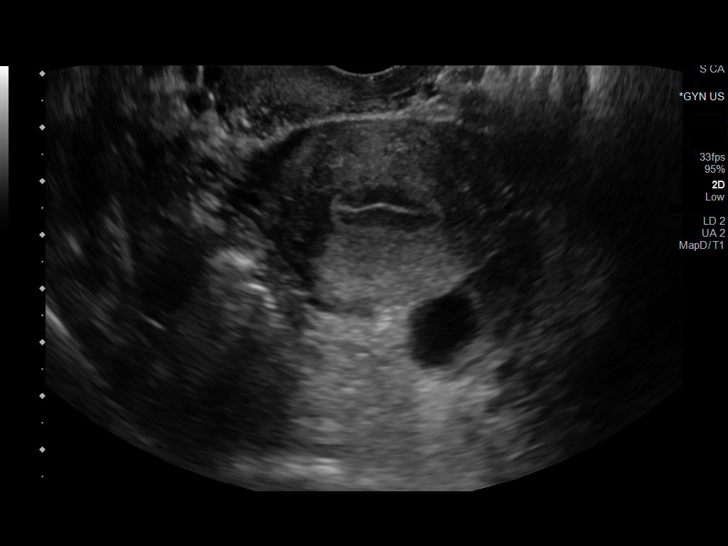

[13 of 25 positions shown; findings below may reference images not displayed]

FINDINGS: Uterus

Measurements: 6.1 x 3.8 x 4.0 cm = volume: 49 mL. Anteverted. Normal
morphology without mass

Endometrium

Thickness: 6 mm.  No endometrial fluid or focal abnormality

Right ovary

Measurements: 2.8 x 1.6 x 2.8 cm = volume: 6.7 mL. Normal morphology
without mass.

Left ovary

Measurements: 2.5 x 1.8 x 2.2 cm = volume: 5.2 mL. Dominant follicle
without mass.

Pulsed Doppler evaluation of both ovaries demonstrates normal
low-resistance arterial and venous waveforms.

Other findings

No free pelvic fluid.  No adnexal masses.
IMPRESSION: Normal exam.

## 2021-11-04 ENCOUNTER — Other Ambulatory Visit: Payer: Self-pay

## 2021-11-04 ENCOUNTER — Emergency Department (HOSPITAL_BASED_OUTPATIENT_CLINIC_OR_DEPARTMENT_OTHER)
Admission: EM | Admit: 2021-11-04 | Discharge: 2021-11-04 | Disposition: A | Discharge status: Home / Self Care | Payer: Commercial Managed Care - PPO | Attending: Emergency Medicine | Admitting: Emergency Medicine

## 2021-11-04 ENCOUNTER — Encounter (HOSPITAL_BASED_OUTPATIENT_CLINIC_OR_DEPARTMENT_OTHER): Payer: Self-pay | Admitting: Urology

## 2021-11-04 DIAGNOSIS — Z20822 Contact with and (suspected) exposure to covid-19: Secondary | ICD-10-CM | POA: Insufficient documentation

## 2021-11-04 DIAGNOSIS — J4520 Mild intermittent asthma, uncomplicated: Secondary | ICD-10-CM | POA: Insufficient documentation

## 2021-11-04 DIAGNOSIS — Z760 Encounter for issue of repeat prescription: Secondary | ICD-10-CM | POA: Insufficient documentation

## 2021-11-04 DIAGNOSIS — R059 Cough, unspecified: Secondary | ICD-10-CM | POA: Diagnosis present

## 2021-11-04 LAB — RESP PANEL BY RT-PCR (FLU A&B, COVID) ARPGX2
Influenza A by PCR: NEGATIVE
Influenza B by PCR: NEGATIVE
SARS Coronavirus 2 by RT PCR: NEGATIVE

## 2021-11-04 MED ORDER — ALBUTEROL SULFATE HFA 108 (90 BASE) MCG/ACT IN AERS: 1.0000 | INHALATION_SPRAY | Freq: Four times a day (QID) | RESPIRATORY_TRACT | 0 refills | Status: AC | PRN

## 2021-11-04 MED ORDER — ALBUTEROL SULFATE (5 MG/ML) 0.5% IN NEBU: 2.5000 mg | INHALATION_SOLUTION | Freq: Four times a day (QID) | RESPIRATORY_TRACT | 12 refills | Status: AC | PRN

## 2021-11-04 MED ORDER — DEXAMETHASONE SODIUM PHOSPHATE 10 MG/ML IJ SOLN
10.0000 mg | Freq: Once | INTRAMUSCULAR | Status: AC
  Administered 2021-11-04: 19:00:00 10 mg via INTRAMUSCULAR
  Filled 2021-11-04: qty 1

## 2021-11-04 MED ORDER — ALBUTEROL SULFATE (2.5 MG/3ML) 0.083% IN NEBU
5.0000 mg | INHALATION_SOLUTION | Freq: Once | RESPIRATORY_TRACT | Status: AC
  Administered 2021-11-04: 19:00:00 5 mg via RESPIRATORY_TRACT
  Filled 2021-11-04: qty 6

## 2021-11-04 NOTE — Discharge Instructions (Addendum)
You are seen in the emergency department today for asthma.  While you are here you were wheezing a bit so we gave you albuterol treatment and some steroids.  I am discharging you with a prescription for an albuterol nebulizer and albuterol inhaler for you to use.  Please follow-up with your primary care provider.  We also tested you for COVID and flu while you were here.  Please follow-up with your MyChart for the results of this.  If they are positive you can use Tylenol and ibuprofen as needed for fever and pain relief and you can use over-the-counter cough and cold medication should you begin having congestion, runny nose, sore throat

## 2021-11-04 NOTE — ED Triage Notes (Signed)
Pt having worsening asthma symptoms over past week, out of inhaler [place] intermittent SOB and cough, denies fever  NAD now

## 2021-11-04 NOTE — ED Provider Notes (Signed)
[facility] EMERGENCY DEPARTMENT Provider Note   CSN: [id number] Arrival date & time: 11/04/21  1321     History Chief Complaint  Patient presents with   Medication Refill    Robin Cabrera is a 20 y.o. female.  With past medical history of asthma who presents to the emergency department for cough.  She states that for 1 week she has had constant cough with intermittent chest tightness and wheezing that is worse at night.  She states that she has intermittently felt short of breath at home.  She states that she does not have an albuterol inhaler as she has not required it for her asthma in "years."  She states that her family discussed 2 new dogs and she thinks this is causing her to have allergies and worsening of her asthma symptoms.  She states that when she is not home she feels like she can breathe better.  She denies any fevers, sputum production, sick contacts.  Currently does not feel short of breath.    Medication Refill     Past Medical History:  Diagnosis Date   Asthma     There are no problems to display for this patient.   History reviewed. No pertinent surgical history.   OB History   No obstetric history on file.     History reviewed. No pertinent family history.  Social History   Tobacco Use   Smoking status: Never   Smokeless tobacco: Never  Vaping Use   Vaping Use: Never used  Substance Use Topics   Alcohol use: Never   Drug use: Never    Home Medications Prior to Admission medications   Not on File    Allergies    Patient has no known allergies.  Review of Systems   Review of Systems  Constitutional:  Negative for fever.  Respiratory:  Positive for cough, chest tightness and wheezing.   Cardiovascular:  Negative for chest pain and palpitations.  Gastrointestinal:  Negative for vomiting.  All other systems reviewed and are negative.  Physical Exam Updated Vital Signs BP 116/77 (BP Location: Left Arm)    Pulse 100     Temp 98 F (36.7 C) (Oral)    Resp 18    Ht 5\' 3"  (1.6 m)    Wt 71.2 kg    LMP 10/16/2021 (Exact Date)    SpO2 100%    BMI 27.81 kg/m   Physical Exam Vitals and nursing note reviewed.  Constitutional:      General: She is not in acute distress.    Appearance: Normal appearance. She is not ill-appearing or toxic-appearing.  HENT:     Head: Normocephalic and atraumatic.     Nose: Nose normal.     Mouth/Throat:     Mouth: Mucous membranes are moist.     Pharynx: Oropharynx is clear.  Eyes:     General: No scleral icterus.    Extraocular Movements: Extraocular movements intact.     Pupils: Pupils are equal, round, and reactive to light.  Cardiovascular:     Pulses: Normal pulses.     Heart sounds: Normal heart sounds. No murmur heard. Pulmonary:     Effort: Pulmonary effort is normal. No respiratory distress.     Breath sounds: Examination of the right-upper field reveals wheezing. Examination of the left-upper field reveals wheezing. Wheezing present.  Musculoskeletal:     Cervical back: Normal range of motion and neck supple.  Skin:    General: Skin is warm and  dry.     Capillary Refill: Capillary refill takes less than 2 seconds.     Findings: No rash.  Neurological:     General: No focal deficit present.     Mental Status: She is alert and oriented to person, place, and time. Mental status is at baseline.  Psychiatric:        Mood and Affect: Mood normal.        Behavior: Behavior normal.        Thought Content: Thought content normal.        Judgment: Judgment normal.    ED Results / Procedures / Treatments   Labs (all labs ordered are listed, but only abnormal results are displayed) Labs Reviewed  RESP PANEL BY RT-PCR (FLU A&B, COVID) [id number]    EKG None  Radiology No results found.  Procedures Procedures   Medications Ordered in ED Medications  albuterol (PROVENTIL) (2.5 MG/3ML) 0.083% nebulizer solution 5 mg (5 mg Nebulization Given 11/04/21 1858)   dexamethasone (DECADRON) injection 10 mg (10 mg Intramuscular Given 11/04/21 1907)    ED Course  I have reviewed the triage vital signs and the nursing notes.  Pertinent labs & imaging results that were available during my care of the patient were reviewed by me and considered in my medical decision making (see chart for details).    MDM Rules/Calculators/A&P 20 year old female who presents emergency department with asthma exacerbation.  Patient well-appearing and nontoxic in appearance.  She is in no acute respiratory distress.  Physical exam does reveal bilateral upper lobe wheezing.  There is no stridor present. Given albuterol treatment and 10 mg IM Decadron with improvement in symptoms. Reevaluation without continued wheezing. Patient is afebrile, no productive cough or shortness of breath concerning for pneumonia.  Defer imaging at this time. Low suspicion for other etiologies of symptoms including pulmonary embolism, congestive heart failure, COPD, foreign body, allergic reaction.  There is no angioedema.  As stated above improved symptoms after albuterol treatment.  Will prescribe her albuterol inhaler and nebulizer for home use.  She is instructed follow-up primary care provider regarding ongoing management of her asthma symptoms.  Strict return emergency department for worsening wheezing or shortness of breath.  She verbalized understanding.  Vital signs are stable.  She is safe for discharge  Final Clinical Impression(s) / ED Diagnoses Final diagnoses:  Mild intermittent asthma without complication    Rx / DC Orders ED Discharge Orders          Ordered    albuterol (VENTOLIN HFA) 108 (90 Base) MCG/ACT inhaler  Every 6 hours PRN        11/04/21 2002    albuterol (PROVENTIL) (5 MG/ML) 0.5% nebulizer solution  Every 6 hours PRN        11/04/21 2003             Cristopher Peru, PA-C 11/04/21 2113    Gloris Manchester, MD 11/06/21 [id number]

## 2022-11-26 ENCOUNTER — Emergency Department (HOSPITAL_BASED_OUTPATIENT_CLINIC_OR_DEPARTMENT_OTHER)
Admission: EM | Admit: 2022-11-26 | Discharge: 2022-11-26 | Disposition: A | Discharge status: Home / Self Care | Payer: PRIVATE HEALTH INSURANCE | Attending: Emergency Medicine | Admitting: Emergency Medicine

## 2022-11-26 ENCOUNTER — Emergency Department (HOSPITAL_BASED_OUTPATIENT_CLINIC_OR_DEPARTMENT_OTHER): Payer: PRIVATE HEALTH INSURANCE

## 2022-11-26 ENCOUNTER — Other Ambulatory Visit: Payer: Self-pay

## 2022-11-26 ENCOUNTER — Encounter (HOSPITAL_BASED_OUTPATIENT_CLINIC_OR_DEPARTMENT_OTHER): Payer: Self-pay | Admitting: Emergency Medicine

## 2022-11-26 DIAGNOSIS — Y92410 Unspecified street and highway as the place of occurrence of the external cause: Secondary | ICD-10-CM | POA: Insufficient documentation

## 2022-11-26 DIAGNOSIS — M25552 Pain in left hip: Secondary | ICD-10-CM | POA: Insufficient documentation

## 2022-11-26 LAB — PREGNANCY, URINE: Preg Test, Ur: NEGATIVE

## 2022-11-26 MED ORDER — METHOCARBAMOL 500 MG PO TABS: 500.0000 mg | ORAL_TABLET | Freq: Two times a day (BID) | ORAL | 0 refills | Status: DC

## 2022-11-26 MED ORDER — IBUPROFEN 400 MG PO TABS
600.0000 mg | ORAL_TABLET | Freq: Once | ORAL | Status: AC
  Administered 2022-11-26: 600 mg via ORAL
  Filled 2022-11-26: qty 1

## 2022-11-26 MED ORDER — LIDOCAINE 5 % EX PTCH: 1.0000 | MEDICATED_PATCH | CUTANEOUS | 0 refills | Status: DC

## 2022-11-26 MED ORDER — LIDOCAINE 5 % EX PTCH
1.0000 | MEDICATED_PATCH | Freq: Once | CUTANEOUS | Status: DC
  Administered 2022-11-26: 1 via TRANSDERMAL
  Filled 2022-11-26: qty 1
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
## 2022-11-26 NOTE — ED Notes (Signed)
D/c paperwork reviewed with pt, including prescriptions.  No questions or concerns voiced at time of d/c. . Pt verbalized understanding, Ambulatory without assistance to ED exit, NAD.   

## 2022-11-26 NOTE — Discharge Instructions (Addendum)
It was a pleasure taking care of you today!  Your imaging in the ED was negative for fracture or dislocations. You will feel more sore in the morning. You are prescribed Robaxin (muscle relaxer) and Lidoderm patch. Do not drive or operate heavy machinery while taking the muscle relaxer as it can make you sleepy/drowsy. You may take over the counter 600 mg Ibuprofen every 6 hours and alternate with 500 mg Tylenol every 6 hours as needed for pain for no more than 7 days. You may apply ice or heat to affected area for up to 15 minutes at a time. Ensure to place a barrier between your skin and the ice/heat. Return to the Emergency Department if you are experiencing increasing/worsening symptoms.

## 2023-01-07 ENCOUNTER — Emergency Department (HOSPITAL_BASED_OUTPATIENT_CLINIC_OR_DEPARTMENT_OTHER)
Admission: EM | Admit: 2023-01-07 | Discharge: 2023-01-07 | Disposition: A | Discharge status: Home / Self Care | Payer: PRIVATE HEALTH INSURANCE | Attending: Emergency Medicine | Admitting: Emergency Medicine

## 2023-01-07 ENCOUNTER — Encounter (HOSPITAL_BASED_OUTPATIENT_CLINIC_OR_DEPARTMENT_OTHER): Payer: Self-pay | Admitting: Emergency Medicine

## 2023-01-07 ENCOUNTER — Other Ambulatory Visit: Payer: Self-pay

## 2023-01-07 DIAGNOSIS — O99891 Other specified diseases and conditions complicating pregnancy: Secondary | ICD-10-CM | POA: Diagnosis present

## 2023-01-07 DIAGNOSIS — O99511 Diseases of the respiratory system complicating pregnancy, first trimester: Secondary | ICD-10-CM | POA: Insufficient documentation

## 2023-01-07 DIAGNOSIS — J4541 Moderate persistent asthma with (acute) exacerbation: Secondary | ICD-10-CM | POA: Insufficient documentation

## 2023-01-07 MED ORDER — PREDNISONE 20 MG PO TABS
40.0000 mg | ORAL_TABLET | Freq: Once | ORAL | Status: AC
  Administered 2023-01-07: 40 mg via ORAL
  Filled 2023-01-07: qty 2

## 2023-01-07 MED ORDER — PREDNISONE 20 MG PO TABS: ORAL_TABLET | ORAL | 0 refills | Status: DC

## 2023-01-07 NOTE — ED Triage Notes (Signed)
Sob x 3 weeks.  Pt is [redacted] weeks pregnant and feels like her asthma is getting worse.

## 2023-01-07 NOTE — ED Provider Notes (Signed)
[place] EMERGENCY DEPARTMENT [facility] Provider Note   CSN: [id number] Arrival date & time: 01/07/23  1236     History  Chief Complaint  Patient presents with   Shortness of Breath    Robin Cabrera is a 22 y.o. female.  22 yo F with a chief complaint of difficulty breathing.  Going on for about 3 weeks.  She tells me that she is [redacted] weeks pregnant and thinks it has some to do with the pregnancy.  She has had no complications this pregnancy.  She is a gravida 1 para 0.  She denies cough congestion or fever.  She thinks is just her asthma this picked up.   Shortness of Breath      Home Medications Prior to Admission medications   Medication Sig Start Date End Date Taking? Authorizing Provider  predniSONE (DELTASONE) 20 MG tablet 2 tabs po daily x 4 days 01/07/23  Yes Deno Etienne, DO  albuterol (PROVENTIL) (5 MG/ML) 0.5% nebulizer solution Take 0.5 mLs (2.5 mg total) by nebulization every 6 (six) hours as needed for wheezing or shortness of breath. 11/04/21   Mickie Hillier, PA-C  albuterol (VENTOLIN HFA) 108 (90 Base) MCG/ACT inhaler Inhale 1-2 puffs into the lungs every 6 (six) hours as needed for wheezing or shortness of breath. 11/04/21 12/04/21  Mickie Hillier, PA-C  lidocaine (LIDODERM) 5 % Place 1 patch onto the skin daily. Remove & Discard patch within 12 hours or as directed by MD 11/26/22   Blue, Soijett A, PA-C  methocarbamol (ROBAXIN) 500 MG tablet Take 1 tablet (500 mg total) by mouth 2 (two) times daily. 11/26/22   Blue, Soijett A, PA-C      Allergies    Patient has no known allergies.    Review of Systems   Review of Systems  Respiratory:  Positive for shortness of breath.     Physical Exam Updated Vital Signs BP 120/71 (BP Location: Right Arm)   Pulse 84   Temp 98 F (36.7 C)   Resp 18   Ht '5\' 4"'$  (1.626 m)   Wt 84.4 kg   LMP 11/11/2022 (Approximate)   SpO2 99%   BMI 31.93 kg/m  Physical Exam Vitals and nursing note reviewed.   Constitutional:      General: She is not in acute distress.    Appearance: She is well-developed. She is not diaphoretic.  HENT:     Head: Normocephalic and atraumatic.  Eyes:     Pupils: Pupils are equal, round, and reactive to light.  Cardiovascular:     Rate and Rhythm: Normal rate and regular rhythm.     Heart sounds: No murmur heard.    No friction rub. No gallop.  Pulmonary:     Effort: Pulmonary effort is normal.     Breath sounds: No wheezing or rales.  Abdominal:     General: There is no distension.     Palpations: Abdomen is soft.     Tenderness: There is no abdominal tenderness.  Musculoskeletal:        General: No tenderness.     Cervical back: Normal range of motion and neck supple.  Skin:    General: Skin is warm and dry.  Neurological:     Mental Status: She is alert and oriented to person, place, and time.  Psychiatric:        Behavior: Behavior normal.     ED Results / Procedures / Treatments   Labs (all labs  ordered are listed, but only abnormal results are displayed) Labs Reviewed - No data to display  EKG None  Radiology No results found.  Procedures Procedures    Medications Ordered in ED Medications  predniSONE (DELTASONE) tablet 40 mg (40 mg Oral Given 01/07/23 1353)    ED Course/ Medical Decision Making/ A&P                             Medical Decision Making Risk Prescription drug management.   22 yo F with a chief complaints of shortness of breath.  Has been going on for 3 weeks.  She is well-appearing nontoxic has no adventitious lung sounds.  No wheezes.  No tachypnea.  She tells me she just used her inhaler.  I discussed risk and benefits of medication use during pregnancy.  Will give burst of steroids.  I suspect the patient likely has an upper respiratory illness as cause of her symptoms and not necessarily asthma.  Urged her to follow-up with her OB/GYN in the office.  2:30 PM:  I have discussed the  diagnosis/risks/treatment options with the patient.  Evaluation and diagnostic testing in the emergency department does not suggest an emergent condition requiring admission or immediate intervention beyond what has been performed at this time.  They will follow up with PCP. We also discussed returning to the ED immediately if new or worsening sx occur. We discussed the sx which are most concerning (e.g., sudden worsening pain, fever, inability to tolerate by mouth) that necessitate immediate return. Medications administered to the patient during their visit and any new prescriptions provided to the patient are listed below.  Medications given during this visit Medications  predniSONE (DELTASONE) tablet 40 mg (40 mg Oral Given 01/07/23 1353)     The patient appears reasonably screen and/or stabilized for discharge and I doubt any other medical condition or other [facility] requiring further screening, evaluation, or treatment in the ED at this time prior to discharge.          Final Clinical Impression(s) / ED Diagnoses Final diagnoses:  Moderate persistent asthma with exacerbation    Rx / DC Orders ED Discharge Orders          Ordered    predniSONE (DELTASONE) 20 MG tablet        01/07/23 Spencer, Zyaire Mccleod, DO 01/07/23 1430
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
## 2023-01-07 NOTE — Discharge Instructions (Signed)
Take the steroids as prescribed.  Please follow-up with your OB/GYN and family doctor.  Please return for worsening difficulty breathing.

## 2023-06-28 ENCOUNTER — Emergency Department (HOSPITAL_BASED_OUTPATIENT_CLINIC_OR_DEPARTMENT_OTHER)
Admission: EM | Admit: 2023-06-28 | Discharge: 2023-06-28 | Disposition: A | Discharge status: Home / Self Care | Payer: PRIVATE HEALTH INSURANCE | Attending: Emergency Medicine | Admitting: Emergency Medicine

## 2023-06-28 ENCOUNTER — Other Ambulatory Visit: Payer: Self-pay

## 2023-06-28 ENCOUNTER — Encounter (HOSPITAL_BASED_OUTPATIENT_CLINIC_OR_DEPARTMENT_OTHER): Payer: Self-pay

## 2023-06-28 DIAGNOSIS — J45909 Unspecified asthma, uncomplicated: Secondary | ICD-10-CM | POA: Insufficient documentation

## 2023-06-28 DIAGNOSIS — O368330 Maternal care for abnormalities of the fetal heart rate or rhythm, third trimester, not applicable or unspecified: Secondary | ICD-10-CM | POA: Insufficient documentation

## 2023-06-28 DIAGNOSIS — O99513 Diseases of the respiratory system complicating pregnancy, third trimester: Secondary | ICD-10-CM | POA: Diagnosis not present

## 2023-06-28 DIAGNOSIS — Z3A32 32 weeks gestation of pregnancy: Secondary | ICD-10-CM | POA: Diagnosis not present

## 2023-06-28 DIAGNOSIS — O368390 Maternal care for abnormalities of the fetal heart rate or rhythm, unspecified trimester, not applicable or unspecified: Secondary | ICD-10-CM

## 2023-06-28 LAB — URINALYSIS, ROUTINE W REFLEX MICROSCOPIC
Bilirubin Urine: NEGATIVE
Glucose, UA: NEGATIVE mg/dL
Hgb urine dipstick: NEGATIVE
Ketones, ur: NEGATIVE mg/dL
Leukocytes,Ua: NEGATIVE
Nitrite: NEGATIVE
Protein, ur: NEGATIVE mg/dL
Specific Gravity, Urine: 1.02 (ref 1.005–1.030)
pH: 7 (ref 5.0–8.0)

## 2023-06-28 MED ORDER — LACTATED RINGERS IV BOLUS
500.0000 mL | Freq: Once | INTRAVENOUS | Status: AC
  Administered 2023-06-28: 500 mL via INTRAVENOUS

## 2023-06-28 NOTE — Progress Notes (Signed)
Received call from [facility]. Pt is a G1P0 at 32 5/[redacted] weeks gestation. Pt is presenting with c/o decreased fetal movement since Friday. Pt denies cramping, leaking of fluid, and vaginal bleeding. Pt gets her care at [facility]. She says that she has been getting weekly ultrasounds because her baby is small. She says her last ultrasound was this past Tuesday and she was told that everything looked okay.

## 2023-06-28 NOTE — Discharge Instructions (Signed)
[facility]. Maternal Entrance.

## 2023-06-28 NOTE — ED Notes (Signed)
[facility] called and are on there way at 4:20

## 2023-06-28 NOTE — Progress Notes (Signed)
Patient ID: Robin Cabrera, female   DOB: 10/14/2001, 22 y.o.   MRN: [id number] Dr. Rhoderick Moody at [facility] called MAU at [facility] RE: FHR tracing, plan to transfer pt for fetal monitoring. Per chart review pt has been getting care in at [facility] so Dr. Vergie Living, attending had recommended transferring pt to [facility] to stay with her provider. CNM asked if pt wanted to be transferred to her provider at [facility] or come to MAU at [facility]. Pt requested to come to MAU at [facility]. Dr. Vergie Living and MAU charge RN informed. CNM called back and pt had changed her mind again and wants to go to [facility]. Dr. Vergie Living updated.

## 2023-06-28 NOTE — ED Notes (Signed)
Fetal heart rate 145

## 2023-06-28 NOTE — ED Triage Notes (Signed)
The patient stated she not felt the baby move as much as normal since Friday. She has felt him move a few times today. No contractions, vaginal bleeding or discharge.

## 2023-06-28 NOTE — Progress Notes (Signed)
Spoke with Sandria Bales RN at [facility]. Pt is having variable decels. I asked her to get an order from the ED MD for IVF, LR and give her at least a bolus.

## 2023-06-28 NOTE — ED Notes (Signed)
I talked with Saunders Glance RN from [facility].

## 2023-06-28 NOTE — ED Notes (Signed)
Called [facility] line for transfer to Labor and Delivery at [facility] at 3:51

## 2023-06-28 NOTE — Progress Notes (Signed)
Spoke with Dr. Vergie Living. Pt should be transferred to [facility] if that is where she gets her care. Sandria Bales RN notified.

## 2023-06-28 NOTE — ED Provider Notes (Signed)
[place] [facility] Provider Note  CSN: [id number] Arrival date & time: 06/28/23 1412  Chief Complaint(s) Decreased Fetal Movement  HPI Robin Cabrera is a 22 y.o. female currently 32 weeks, 5 days pregnant presenting to the emergency department with decreased fetal movement.  Patient reports that since Friday she reports some decreased fetal movement.  She reports maybe on Thursday she had 1 contraction.  Today also had some clear vaginal discharge.  No rush of fluid.  No falls or trauma.  No painful urination.  No chest pain or shortness of breath.  No headaches.  No other symptoms.   Past Medical History Past Medical History:  Diagnosis Date   Asthma    There are no problems to display for this patient.  Home Medication(s) Prior to Admission medications   Medication Sig Start Date End Date Taking? Authorizing Provider  albuterol (PROVENTIL) (5 MG/ML) 0.5% nebulizer solution Take 0.5 mLs (2.5 mg total) by nebulization every 6 (six) hours as needed for wheezing or shortness of breath. 11/04/21   Cristopher Peru, PA-C  albuterol (VENTOLIN HFA) 108 (90 Base) MCG/ACT inhaler Inhale 1-2 puffs into the lungs every 6 (six) hours as needed for wheezing or shortness of breath. 11/04/21 12/04/21  Cristopher Peru, PA-C  lidocaine (LIDODERM) 5 % Place 1 patch onto the skin daily. Remove & Discard patch within 12 hours or as directed by MD 11/26/22   Blue, Soijett A, PA-C  methocarbamol (ROBAXIN) 500 MG tablet Take 1 tablet (500 mg total) by mouth 2 (two) times daily. 11/26/22   Blue, Soijett A, PA-C  predniSONE (DELTASONE) 20 MG tablet 2 tabs po daily x 4 days 01/07/23   Melene Plan, DO                                                                                                                                    Past Surgical History History reviewed. No pertinent surgical history. Family History History reviewed. No pertinent family history.  Social  History Social History   Tobacco Use   Smoking status: Never   Smokeless tobacco: Never  Vaping Use   Vaping status: Never Used  Substance Use Topics   Alcohol use: Never   Drug use: Never   Allergies Patient has no known allergies.  Review of Systems Review of Systems  All other systems reviewed and are negative.   Physical Exam Vital Signs  I have reviewed the triage vital signs BP 104/67   Pulse 80   Temp 99 F (37.2 C) (Oral)   Resp 18   Ht 5\' 4"  (1.626 m)   Wt 92.5 kg   LMP 11/11/2022 (Approximate)   SpO2 100%   BMI 35.02 kg/m  Physical Exam Vitals and nursing note reviewed.  Constitutional:      General: She is not in acute distress.    Appearance: She is well-developed.  HENT:  Head: Normocephalic and atraumatic.     Mouth/Throat:     Mouth: Mucous membranes are moist.  Eyes:     Pupils: Pupils are equal, round, and reactive to light.  Cardiovascular:     Rate and Rhythm: Normal rate and regular rhythm.     Heart sounds: No murmur heard. Pulmonary:     Effort: Pulmonary effort is normal. No respiratory distress.     Breath sounds: Normal breath sounds.  Abdominal:     Palpations: Abdomen is soft.     Tenderness: There is no abdominal tenderness.     Comments: Gravid abdomen  Musculoskeletal:        General: No tenderness.     Right lower leg: No edema.     Left lower leg: No edema.  Skin:    General: Skin is warm and dry.  Neurological:     General: No focal deficit present.     Mental Status: She is alert. Mental status is at baseline.  Psychiatric:        Mood and Affect: Mood normal.        Behavior: Behavior normal.     ED Results and Treatments Labs (all labs ordered are listed, but only abnormal results are displayed) Labs Reviewed  URINALYSIS, ROUTINE W REFLEX MICROSCOPIC                                                                                                                          Radiology No results  found.  Pertinent labs & imaging results that were available during my care of the patient were reviewed by me and considered in my medical decision making (see MDM for details).  Medications Ordered in ED Medications  lactated ringers bolus 500 mL (0 mLs Intravenous Stopped 06/28/23 1551)                                                                                                                                     Procedures Procedures  (including critical care time)  Medical Decision Making / ED Course   MDM:  22 year old female presenting to the emergency department with decreased fetal movement.  Patient overall well-appearing, physical exam with no abdominal tenderness, obviously gravid uterus.  Denies pain, contractions, vaginal bleeding.  Patient had maternal monitoring which did show some variable decelerations.  She also reports some clear vaginal discharge.  No rush of  fluid to suggest rupture of membranes.  Given variable decelerations patient will need further fetal monitoring.  She prefers to get her care at [facility] and they have accepted her to come for further monitoring.  I discussed this with on-call OB Dr. Sandy Salaam.  Patient not currently in active labor.   Clinical Course as of 06/28/23 1658  Sun Jun 28, 2023  [phone] [WS]    Clinical Course User Index [WS] Lonell Grandchild, MD     Additional history obtained: -Additional history obtained from family -External records from outside source obtained and reviewed including: Chart review including previous notes, labs, imaging, consultation notes including outside [facility] records   Lab Tests: -I ordered, reviewed, and interpreted labs.   The pertinent results include:   Labs Reviewed  URINALYSIS, ROUTINE W REFLEX MICROSCOPIC    Notable for normal UA   Medicines ordered and prescription drug management: Meds ordered this encounter  Medications   lactated ringers bolus 500 mL    -I have  reviewed the patients home medicines and have made adjustments as needed   Consultations Obtained: I requested consultation with the OB,  and discussed lab and imaging findings as well as pertinent plan - they recommend: transfer for monitoring   Cardiac Monitoring: The patient was maintained on a cardiac monitor.  I personally viewed and interpreted the cardiac monitored which showed an underlying rhythm of: NSR  Social Determinants of Health:  Diagnosis or treatment significantly limited by social determinants of health: obesity   Reevaluation: After the interventions noted above, I reevaluated the patient and found that their symptoms have stayed the same  Co morbidities that complicate the patient evaluation  Past Medical History:  Diagnosis Date   Asthma       Dispostion: Disposition decision including need for hospitalization was considered, and patient transferred.    Final Clinical Impression(s) / ED Diagnoses Final diagnoses:  Antepartum variable deceleration     This chart was dictated using voice recognition software.  Despite best efforts to proofread,  errors can occur which can change the documentation meaning.    Lonell Grandchild, MD 06/28/23 [phone]

## 2024-01-12 ENCOUNTER — Emergency Department (HOSPITAL_BASED_OUTPATIENT_CLINIC_OR_DEPARTMENT_OTHER): Payer: PRIVATE HEALTH INSURANCE

## 2024-01-12 ENCOUNTER — Emergency Department (HOSPITAL_BASED_OUTPATIENT_CLINIC_OR_DEPARTMENT_OTHER)
Admission: EM | Admit: 2024-01-12 | Discharge: 2024-01-12 | Disposition: A | Discharge status: Home / Self Care | Payer: PRIVATE HEALTH INSURANCE | Attending: Emergency Medicine | Admitting: Emergency Medicine

## 2024-01-12 ENCOUNTER — Encounter (HOSPITAL_BASED_OUTPATIENT_CLINIC_OR_DEPARTMENT_OTHER): Payer: Self-pay

## 2024-01-12 ENCOUNTER — Other Ambulatory Visit: Payer: Self-pay

## 2024-01-12 DIAGNOSIS — R0781 Pleurodynia: Secondary | ICD-10-CM | POA: Insufficient documentation

## 2024-01-12 DIAGNOSIS — M549 Dorsalgia, unspecified: Secondary | ICD-10-CM | POA: Diagnosis present

## 2024-01-12 DIAGNOSIS — M546 Pain in thoracic spine: Secondary | ICD-10-CM | POA: Insufficient documentation

## 2024-01-12 DIAGNOSIS — R109 Unspecified abdominal pain: Secondary | ICD-10-CM | POA: Diagnosis not present

## 2024-01-12 MED ORDER — IBUPROFEN 800 MG PO TABS
800.0000 mg | ORAL_TABLET | Freq: Once | ORAL | Status: AC
  Administered 2024-01-12: 800 mg via ORAL
  Filled 2024-01-12: qty 1

## 2024-01-12 MED ORDER — METHOCARBAMOL 500 MG PO TABS: 500.0000 mg | ORAL_TABLET | Freq: Two times a day (BID) | ORAL | 0 refills | Status: DC

## 2024-01-12 NOTE — ED Triage Notes (Signed)
 Left sided flank pain x 2 days sharp denies n/v,  cough, fevers  no rash, denies injury.    Scales pain 10/10  pain is worse with inspiration, describes as sharp, catchy, pain

## 2024-01-12 NOTE — Discharge Instructions (Addendum)
 Your x-ray looks good.  No pneumonia and no collapsed lung no obvious broken ribs.  The most common thing that would cause pain like this would be if you would pulled a muscle in your back.  Take 4 over the counter ibuprofen tablets 3 times a day or 2 over-the-counter naproxen tablets twice a day for pain. Also take tylenol 1000mg (2 extra strength) four times a day.    Follow up with your doctor in the office.

## 2024-01-12 NOTE — ED Provider Notes (Signed)
 [place] [facility] Provider Note   CSN: [id number] Arrival date & time: 01/12/24  0306     History  Chief Complaint  Patient presents with   Abdominal Pain   Flank Pain    Left sided flank pain x 2 days sharp denies n/v,  cough, fevers     Robin Cabrera is a 23 y.o. female.  23 yo F with a chief complaints of left sided back pain.  Occurred initially a couple days ago and then seemed to get better with some medicine and then she woke up from sleep and it was bothering her again.  Worse with certain positions twisting turning.  She denies cough congestion or fever.  Denies trauma.  Denies radiation of the pain.  Denies nausea or vomiting.     Abdominal Pain Flank Pain Associated symptoms include abdominal pain.       Home Medications Prior to Admission medications   Medication Sig Start Date End Date Taking? Authorizing Provider  methocarbamol (ROBAXIN) 500 MG tablet Take 1 tablet (500 mg total) by mouth 2 (two) times daily. 01/12/24  Yes Melene Plan, DO  albuterol (PROVENTIL) (5 MG/ML) 0.5% nebulizer solution Take 0.5 mLs (2.5 mg total) by nebulization every 6 (six) hours as needed for wheezing or shortness of breath. 11/04/21   Cristopher Peru, PA-C  albuterol (VENTOLIN HFA) 108 (90 Base) MCG/ACT inhaler Inhale 1-2 puffs into the lungs every 6 (six) hours as needed for wheezing or shortness of breath. 11/04/21 12/04/21  Cristopher Peru, PA-C  lidocaine (LIDODERM) 5 % Place 1 patch onto the skin daily. Remove & Discard patch within 12 hours or as directed by MD 11/26/22   Blue, Soijett A, PA-C  predniSONE (DELTASONE) 20 MG tablet 2 tabs po daily x 4 days 01/07/23   Melene Plan, DO      Allergies    Patient has no known allergies.    Review of Systems   Review of Systems  Gastrointestinal:  Positive for abdominal pain.  Genitourinary:  Positive for flank pain.    Physical Exam Updated Vital Signs BP 139/85   Pulse 81   Temp 98.2 F  (36.8 C) (Oral)   Resp 18   Ht 5\' 4"  (1.626 m)   Wt 92 kg   LMP 12/31/2023 (Approximate)   SpO2 100%   BMI 34.81 kg/m  Physical Exam Vitals and nursing note reviewed.  Constitutional:      General: She is not in acute distress.    Appearance: She is well-developed. She is not diaphoretic.  HENT:     Head: Normocephalic and atraumatic.  Eyes:     Pupils: Pupils are equal, round, and reactive to light.  Cardiovascular:     Rate and Rhythm: Normal rate and regular rhythm.     Heart sounds: No murmur heard.    No friction rub. No gallop.  Pulmonary:     Effort: Pulmonary effort is normal.     Breath sounds: No wheezing or rales.  Chest:       Comments: Patient points about her right chest wall about ribs 6 through 8 on the left.  There is no rash.  No obvious crepitus or deformity. Abdominal:     General: There is no distension.     Palpations: Abdomen is soft.     Tenderness: There is no abdominal tenderness.  Musculoskeletal:        General: No tenderness.     Cervical back:  Normal range of motion and neck supple.  Skin:    General: Skin is warm and dry.  Neurological:     Mental Status: She is alert and oriented to person, place, and time.  Psychiatric:        Behavior: Behavior normal.     ED Results / Procedures / Treatments   Labs (all labs ordered are listed, but only abnormal results are displayed) Labs Reviewed - No data to display  EKG None  Radiology DG Chest [facility] 1 View Result Date: 01/12/2024 CLINICAL DATA:  Left-sided flank pain for several days, initial encounter EXAM: PORTABLE CHEST 1 VIEW COMPARISON:  None Available. FINDINGS: The heart size and mediastinal contours are within normal limits. Both lungs are clear. The visualized skeletal structures are unremarkable. IMPRESSION: No active disease. Electronically Signed   By: Alcide Clever M.D.   On: 01/12/2024 03:53    Procedures Procedures    Medications Ordered in ED Medications  ibuprofen  (ADVIL) tablet 800 mg (800 mg Oral Given 01/12/24 0334)    ED Course/ Medical Decision Making/ A&P                                 Medical Decision Making Amount and/or Complexity of Data Reviewed Radiology: ordered.  Risk Prescription drug management.   23 yo F with a chief complaint of left-sided chest discomfort.  Off and on for couple days.  Worse with twisting turning palpation.  Atraumatic.  Will obtain a screening chest x-ray.  Treat as musculoskeletal.  PCP follow-up.  Plain film of the chest independently interpreted by me without focal infiltrate or pneumothorax or obvious rib fracture.  4:13 AM:  I have discussed the diagnosis/risks/treatment options with the patient and family.  Evaluation and diagnostic testing in the emergency department does not suggest an emergent condition requiring admission or immediate intervention beyond what has been performed at this time.  They will follow up with PCP. We also discussed returning to the ED immediately if new or worsening sx occur. We discussed the sx which are most concerning (e.g., sudden worsening pain, fever, inability to tolerate by mouth) that necessitate immediate return. Medications administered to the patient during their visit and any new prescriptions provided to the patient are listed below.  Medications given during this visit Medications  ibuprofen (ADVIL) tablet 800 mg (800 mg Oral Given 01/12/24 0334)     The patient appears reasonably screen and/or stabilized for discharge and I doubt any other medical condition or other [facility] requiring further screening, evaluation, or treatment in the ED at this time prior to discharge.          Final Clinical Impression(s) / ED Diagnoses Final diagnoses:  Acute left-sided thoracic back pain    Rx / DC Orders ED Discharge Orders          Ordered    methocarbamol (ROBAXIN) 500 MG tablet  2 times daily        01/12/24 0403              Melene Plan, DO 01/12/24  0413

## 2024-02-09 ENCOUNTER — Ambulatory Visit (INDEPENDENT_AMBULATORY_CARE_PROVIDER_SITE_OTHER): Payer: PRIVATE HEALTH INSURANCE | Admitting: Internal Medicine

## 2024-02-09 ENCOUNTER — Encounter: Payer: Self-pay | Admitting: Internal Medicine

## 2024-02-09 VITALS — BP 118/74 | HR 86 | Temp 97.7°F | Resp 17 | Ht 63.6 in | Wt 200.0 lb

## 2024-02-09 DIAGNOSIS — J3089 Other allergic rhinitis: Secondary | ICD-10-CM | POA: Diagnosis not present

## 2024-02-09 DIAGNOSIS — H1013 Acute atopic conjunctivitis, bilateral: Secondary | ICD-10-CM | POA: Diagnosis not present

## 2024-02-09 DIAGNOSIS — J4540 Moderate persistent asthma, uncomplicated: Secondary | ICD-10-CM | POA: Diagnosis not present

## 2024-02-09 DIAGNOSIS — L2084 Intrinsic (allergic) eczema: Secondary | ICD-10-CM | POA: Diagnosis not present

## 2024-02-09 DIAGNOSIS — H1010 Acute atopic conjunctivitis, unspecified eye: Secondary | ICD-10-CM

## 2024-02-09 MED ORDER — BUDESONIDE-FORMOTEROL FUMARATE 80-4.5 MCG/ACT IN AERO: 2.0000 | INHALATION_SPRAY | Freq: Two times a day (BID) | RESPIRATORY_TRACT | 5 refills | Status: DC

## 2024-02-09 MED ORDER — TRIAMCINOLONE ACETONIDE 0.1 % EX OINT: TOPICAL_OINTMENT | CUTANEOUS | 1 refills | Status: DC

## 2024-02-09 MED ORDER — MONTELUKAST SODIUM 10 MG PO TABS: 10.0000 mg | ORAL_TABLET | Freq: Every day | ORAL | 5 refills | Status: DC

## 2024-02-09 MED ORDER — FEXOFENADINE HCL 180 MG PO TABS: 180.0000 mg | ORAL_TABLET | Freq: Every day | ORAL | 5 refills | Status: DC

## 2024-02-09 NOTE — Progress Notes (Signed)
 NEW PATIENT Date of Service/Encounter:  02/09/24 Referring provider: No ref. provider found Primary care provider: Patient, No Pcp Per  Subjective:  Robin Cabrera is a 23 y.o. female  presenting today for evaluation of allergies, wheezing, cough  History obtained from: chart review and patient.   Discussed the use of AI scribe software for clinical note transcription with the patient, who gave verbal consent to proceed.  History of Present Illness Robin Cabrera is a 23 year old female with asthma who presents with wheezing and coughing, concern for allergies.  She experiences daily wheezing and coughing, accompanied by dyspnea and shortness of breath. These symptoms began during her pregnancy and have persisted postpartum. She was treated with oral steroids during pregnancy for severe symptoms which prompted ED visit, which provided temporary relief. She was prescribed albuterol at this point as well.  Currently, she uses an albuterol inhaler daily to multiple times per day to manage her symptoms. Her breathing difficulties are exacerbated by allergy flares and illnesses, but not by cold weather or exercise.  Her allergies are seasonal, with symptoms including rhinorrhea, pruritic eyes, and itchy throat, particularly worsening in spring and summer. She has not experienced asthma-like symptoms prior to her pregnancy. She has mild reactions to petting her dog though the presence of the dog in the house does not bother her. She has not tried nasal sprays due to discomfort but is open to using them correctly. No issues with reflux or heartburn and no known food or drug allergies. No prior allergy testing.    She has a history of eczema since infancy, with flare-ups occurring on her arms, neck, and back. During pregnancy, eczema flared on her arms. She manages it with over-the-counter hydrocortisone, which resolves the flare-ups within a month. She is prone to dry skin and uses cocoa butter  lotion and Vaseline for moisture.   Other allergy screening: Asthma: yes Rhino conjunctivitis: yes Food allergy: no Medication allergy: no Hymenoptera allergy: no Urticaria: no Eczema:yes History of recurrent infections suggestive of immunodeficency: no Vaccinations are up to date.   Past Medical History: Past Medical History:  Diagnosis Date   Asthma    Medication List:  Current Outpatient Medications  Medication Sig Dispense Refill   albuterol (PROVENTIL) (5 MG/ML) 0.5% nebulizer solution Take 0.5 mLs (2.5 mg total) by nebulization every 6 (six) hours as needed for wheezing or shortness of breath. 20 mL 12   budesonide-formoterol (SYMBICORT) 80-4.5 MCG/ACT inhaler Inhale 2 puffs into the lungs 2 (two) times daily. 1 each 5   fexofenadine (ALLEGRA) 180 MG tablet Take 1 tablet (180 mg total) by mouth daily. 30 tablet 5   montelukast (SINGULAIR) 10 MG tablet Take 1 tablet (10 mg total) by mouth at bedtime. 30 tablet 5   triamcinolone ointment (KENALOG) 0.1 % Apply topically twice daily to BODY as needed for red, sandpaper like rash.  Do not use on face, groin or armpits. 80 g 1   albuterol (VENTOLIN HFA) 108 (90 Base) MCG/ACT inhaler Inhale 1-2 puffs into the lungs every 6 (six) hours as needed for wheezing or shortness of breath. 18 g 0   No current facility-administered medications for this visit.   Known Allergies:  No Known Allergies Past Surgical History: History reviewed. No pertinent surgical history. Family History: Family History  Problem Relation Age of Onset   Asthma Brother    Allergic rhinitis Brother    Allergic rhinitis Brother    Social History: Robin Cabrera lives single-family home, no water  damage.  Hardwood throughout.  Electric heating, central cooling.  Dog without access to bedroom.  No roaches in the house and bed is 2 feet off floor.  Works as a Tax inspector.  She is exposed to fumes and chemicals and dust at this job.   No tobacco exposure..   ROS:  All other systems negative except as noted per HPI.  Objective:  Blood pressure 118/74, pulse 86, temperature 97.7 F (36.5 C), temperature source Temporal, resp. rate 17, height 5' 3.6" (1.615 m), weight 200 lb (90.7 kg), last menstrual period 12/31/2023, SpO2 98%, unknown if currently breastfeeding. Body mass index is 34.76 kg/m. Physical Exam:  General Appearance:  Alert, cooperative, no distress, appears stated age  Head:  Normocephalic, without obvious abnormality, atraumatic  Eyes:  Conjunctiva clear, EOM's intact  Ears EACs normal bilaterally  Nose: Nares normal, normal mucosa, no visible anterior polyps, and septum midline  Throat: Lips, tongue normal; teeth and gums normal, + cobblestoning  Neck: Supple, symmetrical  Lungs:   clear to auscultation bilaterally, Respirations unlabored, no coughing  Heart:  regular rate and rhythm and no murmur, Appears well perfused  Extremities: No edema  Skin: Skin color, texture, turgor normal and no rashes or lesions on visualized portions of skin  Neurologic: No gross deficits   Diagnostics: Spirometry:  Tracings reviewed. Her effort: Good reproducible efforts. FVC: 2.86L (pre), 2.66L  (post) FEV1: 2.13L, 75% predicted (pre), 2.17 L, 77% predicted (post) FEV1/FVC ratio: 74 (pre), 82 (post) Interpretation: Spirometry consistent with mild obstructive disease.  No significant postbronchodilator response Please see scanned spirometry results for details.   Labs:  Lab Orders  No laboratory test(s) ordered today     Assessment and Plan  Assessment and Plan Assessment & Plan Asthma Experiencing daily asthma symptoms, including wheezing, coughing, and dyspnea, which began during pregnancy and have persisted postpartum. Symptoms are exacerbated by seasonal allergies and illness. Currently using albuterol inhaler daily for relief. Breathing tests: Inflammation in your lungs -Start Symbicort 80 mcg 2 puffs  twice daily with spacer.  Rinse mouth after use. -Continue albuterol 2 puffs every 4-6 hours as needed for cough, wheeze, dyspnea - Use a spacer with all inhalers - please keep track of how often you are needing rescue inhaler Albuterol (Proair/Ventolin) as this will help guide future management - Asthma is not controlled if:  - Symptoms are occurring >2 times a week  during the day  OR  - >2 times a month nighttime awakenings  - Please call the clinic to schedule a follow up if these symptoms arise    Seasonal Allergies Experiences seasonal allergies, particularly in spring and summer, with symptoms including rhinorrhea, pruritic eyes, and itchy throat. Using Allegra D for symptom relief.  - Stop Allegra-D  - Start Allegra 180mg  daily (hold for 3 days prior to allergy test) - Start Flonase 1 spray per nostril twice daily.  Aim upwards and outwards - Start Singulair 10 mg daily.  Monitor for nightmares - Return to clinic for allergy testing (1 through 68)  Eczema Eczema with occasional flares on the arms, neck, and back. Uses over-the-counter hydrocortisone cream for management, which resolves flares within a month. Also uses cocoa butter lotion and Vaseline for xerosis and hypoallergenic products for skin care. Daily Care For Maintenance (daily and continue even once eczema controlled) - Recommend hypoallergenic hydrating ointment at least twice daily.  This must be done daily for control of flares. (Great options include Vaseline, CeraVe, Aquaphor, Aveeno,  Cetaphil, VaniCream, etc) - Recommend avoiding detergents, soaps or lotions with fragrances/dyes, and instead using products which are hypoallergenic, use second rinse cycle when washing clothes -Wear lose breathable clothing, avoid wool -Avoid extremes of humidity - Limit showers/baths to 5 minutes and use luke warm water instead of hot, pat dry following baths, and apply moisturizer - can use steroid creams as detailed below up to  twice weekly for prevention of flares.  For Flares:(add this to maintenance therapy if needed for flares) - Triamcinolone 0.1% to body for moderate flares-apply topically twice daily to red, raised areas of skin, followed by moisturizer  Follow up: For allergy testing (1 through 7), hold Allegra and all other and histamines for 3 days prior to appointment  This note in its entirety was forwarded to the Provider who requested this consultation.  Other: reviewed spirometry technique and reviewed inhaler technique  Thank you for your kind referral. I appreciate the opportunity to take part in Lummie's care. Please do not hesitate to contact me with questions.  Sincerely,  Thank you so much for letting me partake in your care today.  Don't hesitate to reach out if you have any additional concerns!  Ferol Luz, MD  [facility] [place] [place]

## 2024-02-09 NOTE — Patient Instructions (Addendum)
 Asthma, moderate persistent not well controlled  Experiencing daily asthma symptoms, including wheezing, coughing, and dyspnea, which began during pregnancy and have persisted postpartum. Symptoms are exacerbated by seasonal allergies and illness. Currently using albuterol inhaler daily for relief. Breathing tests: Inflammation in your lungs -Start Symbicort 80 mcg 2 puffs twice daily with spacer.  Rinse mouth after use. -Continue albuterol 2 puffs every 4-6 hours as needed for cough, wheeze, dyspnea - Use a spacer with all inhalers - please keep track of how often you are needing rescue inhaler Albuterol (Proair/Ventolin) as this will help guide future management - Asthma is not controlled if:  - Symptoms are occurring >2 times a week  during the day  OR  - >2 times a month nighttime awakenings  - Please call the clinic to schedule a follow up if these symptoms arise    Seasonal Allergies Experiences seasonal allergies, particularly in spring and summer, with symptoms including rhinorrhea, pruritic eyes, and itchy throat. Using Allegra D for symptom relief.  - Stop Allegra-D  - Start Allegra 180mg  daily (hold for 3 days prior to allergy test) - Start Flonase 1 spray per nostril twice daily.  Aim upwards and outwards - Start Singulair 10 mg daily.  Monitor for nightmares - Return to clinic for allergy testing (1 through 68)  Eczema Eczema with occasional flares on the arms, neck, and back. Uses over-the-counter hydrocortisone cream for management, which resolves flares within a month. Also uses cocoa butter lotion and Vaseline for xerosis and hypoallergenic products for skin care. Daily Care For Maintenance (daily and continue even once eczema controlled) - Recommend hypoallergenic hydrating ointment at least twice daily.  This must be done daily for control of flares. (Great options include Vaseline, CeraVe, Aquaphor, Aveeno, Cetaphil, VaniCream, etc) - Recommend avoiding detergents, soaps  or lotions with fragrances/dyes, and instead using products which are hypoallergenic, use second rinse cycle when washing clothes -Wear lose breathable clothing, avoid wool -Avoid extremes of humidity - Limit showers/baths to 5 minutes and use luke warm water instead of hot, pat dry following baths, and apply moisturizer - can use steroid creams as detailed below up to twice weekly for prevention of flares.  For Flares:(add this to maintenance therapy if needed for flares) - Triamcinolone 0.1% to body for moderate flares-apply topically twice daily to red, raised areas of skin, followed by moisturizer  Follow up: For allergy testing (1 through 19), hold Allegra and all other and histamines for 3 days prior to appointment

## 2024-02-10 ENCOUNTER — Telehealth: Payer: Self-pay

## 2024-02-10 MED ORDER — TRIAMCINOLONE ACETONIDE 0.1 % EX OINT: TOPICAL_OINTMENT | CUTANEOUS | 1 refills | Status: AC

## 2024-02-10 MED ORDER — MONTELUKAST SODIUM 10 MG PO TABS: 10.0000 mg | ORAL_TABLET | Freq: Every day | ORAL | 5 refills | Status: AC

## 2024-02-10 MED ORDER — BUDESONIDE-FORMOTEROL FUMARATE 80-4.5 MCG/ACT IN AERO: 2.0000 | INHALATION_SPRAY | Freq: Two times a day (BID) | RESPIRATORY_TRACT | 5 refills | Status: AC

## 2024-02-10 MED ORDER — FEXOFENADINE HCL 180 MG PO TABS: 180.0000 mg | ORAL_TABLET | Freq: Every day | ORAL | 5 refills | Status: AC

## 2024-02-10 NOTE — Addendum Note (Signed)
 Addended by: Modesto Charon on: 02/10/2024 10:25 AM   Modules accepted: Orders

## 2024-02-10 NOTE — Telephone Encounter (Signed)
 Meds was resent to [facility].

## 2024-02-16 ENCOUNTER — Ambulatory Visit: Payer: PRIVATE HEALTH INSURANCE | Admitting: Internal Medicine

## 2024-02-16 DIAGNOSIS — H1013 Acute atopic conjunctivitis, bilateral: Secondary | ICD-10-CM

## 2024-02-16 DIAGNOSIS — J302 Other seasonal allergic rhinitis: Secondary | ICD-10-CM | POA: Diagnosis not present

## 2024-02-16 DIAGNOSIS — J3089 Other allergic rhinitis: Secondary | ICD-10-CM | POA: Diagnosis not present

## 2024-02-16 DIAGNOSIS — H1010 Acute atopic conjunctivitis, unspecified eye: Secondary | ICD-10-CM

## 2024-02-16 DIAGNOSIS — L2084 Intrinsic (allergic) eczema: Secondary | ICD-10-CM

## 2024-02-16 DIAGNOSIS — J4540 Moderate persistent asthma, uncomplicated: Secondary | ICD-10-CM

## 2024-02-16 NOTE — Patient Instructions (Addendum)
 Allergy Test (02/16/24): Positive to grass, weeds, trees, molds, dust mite, cat, dog, mixed feathers, horse, roach Start avoidance measures Strongly recommend allergy injections, we can discuss this once asthma is better controlled  Asthma, moderate persistent  Experiencing daily asthma symptoms, including wheezing, coughing, and dyspnea, which began during pregnancy and have persisted postpartum. Symptoms are exacerbated by seasonal allergies and illness. Currently using albuterol inhaler daily for relief. - Continue Symbicort 80 mcg 2 puffs twice daily with spacer.  Rinse mouth after use. - Continue albuterol 2 puffs every 4-6 hours as needed for cough, wheeze, dyspnea - Use a spacer with all inhalers - please keep track of how often you are needing rescue inhaler Albuterol (Proair/Ventolin) as this will help guide future management - Asthma is not controlled if:  - Symptoms are occurring >2 times a week  during the day  OR  - >2 times a month nighttime awakenings  - Please call the clinic to schedule a follow up if these symptoms arise    Seasonal Allergies Experiences seasonal allergies, particularly in spring and summer, with symptoms including rhinorrhea, pruritic eyes, and itchy throat. Using Allegra D for symptom relief.  - Continue Allegra 180mg  daily  - Continue Flonase 1 spray per nostril twice daily.  Aim upwards and outwards - ContinueSingulair 10 mg daily.  Monitor for nightmares   Eczema Eczema with occasional flares on the arms, neck, and back. Uses over-the-counter hydrocortisone cream for management, which resolves flares within a month. Also uses cocoa butter lotion and Vaseline for xerosis and hypoallergenic products for skin care. Daily Care For Maintenance (daily and continue even once eczema controlled) - Recommend hypoallergenic hydrating ointment at least twice daily.  This must be done daily for control of flares. (Great options include Vaseline, CeraVe, Aquaphor,  Aveeno, Cetaphil, VaniCream, etc) - Recommend avoiding detergents, soaps or lotions with fragrances/dyes, and instead using products which are hypoallergenic, use second rinse cycle when washing clothes -Wear lose breathable clothing, avoid wool -Avoid extremes of humidity - Limit showers/baths to 5 minutes and use luke warm water instead of hot, pat dry following baths, and apply moisturizer - can use steroid creams as detailed below up to twice weekly for prevention of flares.  For Flares:(add this to maintenance therapy if needed for flares) - Triamcinolone 0.1% to body for moderate flares-apply topically twice daily to red, raised areas of skin, followed by moisturizer  Follow up: 2-3 months   Reducing Pollen Exposure  The American Academy of Allergy, Asthma and Immunology suggests the following steps to reduce your exposure to pollen during allergy seasons.    Do not hang sheets or clothing out to dry; pollen may collect on these items. Do not mow lawns or spend time around freshly cut grass; mowing stirs up pollen. Keep windows closed at night.  Keep car windows closed while driving. Minimize morning activities outdoors, a time when pollen counts are usually at their highest. Stay indoors as much as possible when pollen counts or humidity is high and on windy days when pollen tends to remain in the air longer. Use air conditioning when possible.  Many air conditioners have filters that trap the pollen spores. Use a HEPA room air filter to remove pollen form the indoor air you breathe.  Control of Dog or Cat Allergen  Avoidance is the best way to manage a dog or cat allergy. If you have a dog or cat and are allergic to dog or cats, consider removing the dog or cat  from the home. If you have a dog or cat but don't want to find it a new home, or if your family wants a pet even though someone in the household is allergic, here are some strategies that may help keep symptoms at  bay:  Keep the pet out of your bedroom and restrict it to only a few rooms. Be advised that keeping the dog or cat in only one room will not limit the allergens to that room. Don't pet, hug or kiss the dog or cat; if you do, wash your hands with soap and water. High-efficiency particulate air (HEPA) cleaners run continuously in a bedroom or living room can reduce allergen levels over time. Regular use of a high-efficiency vacuum cleaner or a central vacuum can reduce allergen levels. Giving your dog or cat a bath at least once a week can reduce airborne allergen.  DUST MITE AVOIDANCE MEASURES:  There are three main measures that need and can be taken to avoid house dust mites:  Reduce accumulation of dust in general -reduce furniture, clothing, carpeting, books, stuffed animals, especially in bedroom  Separate yourself from the dust -use pillow and mattress encasements (can be found at stores such as [employer] or online) -avoid direct exposure to air condition flow -use a HEPA filter device, especially in the bedroom; you can also use a HEPA filter vacuum cleaner -wipe dust with a moist towel instead of a dry towel or broom when cleaning  Decrease mites and/or their secretions -wash clothing and linen and stuffed animals at highest temperature possible, at least every 2 weeks -stuffed animals can also be placed in a bag and put in a freezer overnight  Despite the above measures, it is impossible to eliminate dust mites or their allergen completely from your home.  With the above measures the burden of mites in your home can be diminished, with the goal of minimizing your allergic symptoms.  Success will be reached only when implementing and using all means together.  Control of Mold Allergen   Mold and fungi can grow on a variety of surfaces provided certain temperature and moisture conditions exist.  Outdoor molds grow on plants, decaying vegetation and soil.  The major  outdoor mold, Alternaria and Cladosporium, are found in very high numbers during hot and dry conditions.  Generally, a late Summer - Fall peak is seen for common outdoor fungal spores.  Rain will temporarily lower outdoor mold spore count, but counts rise rapidly when the rainy period ends.  The most important indoor molds are Aspergillus and Penicillium.  Dark, humid and poorly ventilated basements are ideal sites for mold growth.  The next most common sites of mold growth are the bathroom and the kitchen.  Outdoor (Seasonal) Mold Control  Use air conditioning and keep windows closed Avoid exposure to decaying vegetation. Avoid leaf raking. Avoid grain handling. Consider wearing a face mask if working in moldy areas.    Indoor (Perennial) Mold Control   Maintain humidity below 50%. Clean washable surfaces with 5% bleach solution. Remove sources e.g. contaminated carpets.    Control of Cockroach Allergen  Cockroach allergen has been identified as an important cause of acute attacks of asthma, especially in urban settings.  There are fifty-five species of cockroach that exist in the [place], however only three, the [place], [place] species produce allergen that can affect patients with Asthma.  Allergens can be obtained from fecal particles, egg casings and secretions from cockroaches.  Remove food sources. Reduce access to water. Seal access and entry points. Spray runways with 0.5-1% Diazinon or Chlorpyrifos Blow boric acid power under stoves and refrigerator. Place bait stations (hydramethylnon) at feeding sites.

## 2024-02-16 NOTE — Progress Notes (Unsigned)
 Date of Service/Encounter:  02/16/24  Allergy testing appointment   Initial visit on 02/09/24, seen for asthma, rhinitis, eczema .  Please see that note for additional details.  Today reports for allergy diagnostic testing:    DIAGNOSTICS:  Skin Testing: Environmental allergy panel and select foods. Adequate positive and negative controls Results discussed with patient/family.  Airborne Adult Perc - 02/16/24 1520     Time Antigen Placed 1520    Allergen Manufacturer Waynette Buttery    Location Back    Number of Test 55    1. Control-Buffer 50% Glycerol Negative    2. Control-Histamine 4+    3. Bahia 4+    4. [place] 4+    5. Johnson 4+    6. Kentucky Blue 4+    7. Meadow Fescue 4+    8. Perennial Rye 4+    9. Timothy 4+    10. Ragweed Mix 3+    11. Cocklebur 3+    12. Plantain,  English Negative    13. Baccharis Negative    14. Dog Fennel 3+    15. Russian Thistle 3+    16. Lamb's Quarters 3+    17. Sheep Sorrell 3+    18. Rough Pigweed 3+    19. Marsh Elder, Rough Negative    20. Mugwort, Common Negative    21. Box, Elder 2+    22. Cedar, red 2+    23. Sweet Gum 4+    24. Pecan Pollen 4+    25. Pine Mix 3+    26. Walnut, Black Pollen 3+    27. Red Mulberry 3+    28. Ash Mix 4+    29. Birch Mix 4+    30. Beech American 4+    31. Cottonwood, Eastern 4+    32. Hickory, White 4+    33. Maple Mix 3+    34. Oak, [place] Mix 4+    35. Sycamore Eastern 3+    36. Alternaria Alternata Negative    37. Cladosporium Herbarum Negative    38. Aspergillus Mix Negative    39. Penicillium Mix Negative    40. Bipolaris Sorokiniana (Helminthosporium) Negative    41. Drechslera Spicifera (Curvularia) Negative    42. Mucor Plumbeus Negative    43. Fusarium Moniliforme 2+    44. Aureobasidium Pullulans (pullulara) 3+    45. Rhizopus Oryzae 3+    46. Botrytis Cinera 3+    47. Epicoccum Nigrum 3+    48. Phoma Betae 3+    49. Dust Mite Mix 2+    50. Cat Hair 10,000 BAU/ml 4+    51.   Dog Epithelia 4+    52. Mixed Feathers 2+    53. Horse Epithelia 2+    54. Cockroach, German 2+    55. Tobacco Leaf Negative             13 Food Perc - 02/16/24 1520       Test Information   Time Antigen Placed 1520    Allergen Manufacturer Greer    Location Back    Number of allergen test 13      Food   1. Peanut Negative    2. Soybean Negative    3. Wheat Negative    4. Sesame Negative    5. Milk, Cow Negative    6. Casein Negative    7. Egg White, Chicken Negative    8. Shellfish Mix Negative    9. Fish Mix Negative  10. Cashew Negative    11. Walnut Food Negative    12. Almond Negative    13. Hazelnut Negative             Intradermal - 02/16/24 1548     Allergen Manufacturer Waynette Buttery    Location Arm    Number of Test 5    Control Negative   neg   Ragweed Mix 3+    Mold 1 Negative    Mold 2 3+    Mold 3 3+             Allergy testing results were read and interpreted by myself, documented by clinical staff.  Patient provided with copy of allergy testing along with avoidance measures when indicated.   Ferol Luz, MD  [facility]

## 2024-03-03 ENCOUNTER — Ambulatory Visit: Payer: PRIVATE HEALTH INSURANCE | Admitting: Internal Medicine

## 2024-03-17 ENCOUNTER — Telehealth: Payer: Self-pay | Admitting: Internal Medicine

## 2024-03-17 NOTE — Telephone Encounter (Signed)
 Robin Cabrera called and stated that she would like to speak with someone about changing up her medications. She stated that the Symbicort  is giving her a sore throat, and the Singulair  is also having negative side effects.

## 2024-03-17 NOTE — Telephone Encounter (Signed)
 Hi Robin Cabrera! For patient call backs, please forward to the clinical pool of the clinic the patient is followed at.  [facility]: Can you call her to find out her specific concerns and forward message to Dr. Jolayne Natter as this is her patient?  Thanks everyone!

## 2024-03-22 NOTE — Telephone Encounter (Signed)
 She should stop the singulair .  In regards to her asthma she can decrease the symbicort  to 1 puff twice daily
